# Patient Record
Sex: Female | Born: 1935 | Race: Black or African American | Hispanic: No | Smoking: Former smoker
Health system: Southern US, Community
[De-identification: ages and names within clinical notes are randomized; demographics above are authoritative.]

## PROBLEM LIST (undated history)

## (undated) DIAGNOSIS — IMO0002 Reserved for concepts with insufficient information to code with codable children: Secondary | ICD-10-CM

## (undated) DIAGNOSIS — H5462 Unqualified visual loss, left eye, normal vision right eye: Secondary | ICD-10-CM

## (undated) DIAGNOSIS — E785 Hyperlipidemia, unspecified: Secondary | ICD-10-CM

## (undated) DIAGNOSIS — E041 Nontoxic single thyroid nodule: Secondary | ICD-10-CM

## (undated) DIAGNOSIS — F329 Major depressive disorder, single episode, unspecified: Secondary | ICD-10-CM

## (undated) DIAGNOSIS — G309 Alzheimer's disease, unspecified: Secondary | ICD-10-CM

## (undated) DIAGNOSIS — Z87440 Personal history of urinary (tract) infections: Secondary | ICD-10-CM

## (undated) DIAGNOSIS — E870 Hyperosmolality and hypernatremia: Secondary | ICD-10-CM

## (undated) DIAGNOSIS — H3530 Unspecified macular degeneration: Secondary | ICD-10-CM

## (undated) DIAGNOSIS — N289 Disorder of kidney and ureter, unspecified: Secondary | ICD-10-CM

## (undated) DIAGNOSIS — R739 Hyperglycemia, unspecified: Secondary | ICD-10-CM

## (undated) DIAGNOSIS — Z0000 Encounter for general adult medical examination without abnormal findings: Secondary | ICD-10-CM

## (undated) DIAGNOSIS — C50919 Malignant neoplasm of unspecified site of unspecified female breast: Secondary | ICD-10-CM

## (undated) DIAGNOSIS — Z9889 Other specified postprocedural states: Secondary | ICD-10-CM

## (undated) DIAGNOSIS — F32A Depression, unspecified: Secondary | ICD-10-CM

## (undated) DIAGNOSIS — F0280 Dementia in other diseases classified elsewhere without behavioral disturbance: Secondary | ICD-10-CM

## (undated) HISTORY — DX: Disorder of kidney and ureter, unspecified: N28.9

## (undated) HISTORY — DX: Hyperlipidemia, unspecified: E78.5

## (undated) HISTORY — DX: Alzheimer's disease, unspecified: G30.9

## (undated) HISTORY — DX: Major depressive disorder, single episode, unspecified: F32.9

## (undated) HISTORY — DX: Other specified postprocedural states: Z98.890

## (undated) HISTORY — DX: Nontoxic single thyroid nodule: E04.1

## (undated) HISTORY — DX: Encounter for general adult medical examination without abnormal findings: Z00.00

## (undated) HISTORY — DX: Dementia in other diseases classified elsewhere, unspecified severity, without behavioral disturbance, psychotic disturbance, mood disturbance, and anxiety: F02.80

## (undated) HISTORY — DX: Hyperglycemia, unspecified: R73.9

## (undated) HISTORY — DX: Depression, unspecified: F32.A

## (undated) HISTORY — DX: Unqualified visual loss, left eye, normal vision right eye: H54.62

## (undated) HISTORY — DX: Reserved for concepts with insufficient information to code with codable children: IMO0002

## (undated) HISTORY — DX: Unspecified macular degeneration: H35.30

## (undated) HISTORY — DX: Hyperosmolality and hypernatremia: E87.0

## (undated) HISTORY — DX: Malignant neoplasm of unspecified site of unspecified female breast: C50.919

## (undated) HISTORY — PX: VAGINAL HYSTERECTOMY: SHX2639

## (undated) HISTORY — DX: Personal history of urinary (tract) infections: Z87.440

---

## 2000-10-08 ENCOUNTER — Encounter: Payer: Self-pay | Admitting: Urology

## 2000-10-10 ENCOUNTER — Ambulatory Visit (HOSPITAL_COMMUNITY): Admission: RE | Admit: 2000-10-10 | Discharge: 2000-10-10 | Payer: Self-pay | Admitting: Urology

## 2000-10-10 ENCOUNTER — Encounter (INDEPENDENT_AMBULATORY_CARE_PROVIDER_SITE_OTHER): Payer: Self-pay | Admitting: Specialist

## 2005-12-24 HISTORY — PX: BREAST LUMPECTOMY: SHX2

## 2006-01-24 DIAGNOSIS — Z9889 Other specified postprocedural states: Secondary | ICD-10-CM

## 2006-01-24 HISTORY — DX: Other specified postprocedural states: Z98.890

## 2006-03-08 ENCOUNTER — Ambulatory Visit: Admission: RE | Admit: 2006-03-08 | Discharge: 2006-05-14 | Payer: Self-pay | Admitting: *Deleted

## 2006-03-11 ENCOUNTER — Ambulatory Visit: Payer: Self-pay | Admitting: Oncology

## 2006-04-05 LAB — CBC WITH DIFFERENTIAL/PLATELET
Basophils Absolute: 0.1 10*3/uL (ref 0.0–0.1)
Eosinophils Absolute: 0.1 10*3/uL (ref 0.0–0.5)
HCT: 38.9 % (ref 34.8–46.6)
HGB: 13.2 g/dL (ref 11.6–15.9)
LYMPH%: 38.8 % (ref 14.0–48.0)
MCV: 89.8 fL (ref 81.0–101.0)
MONO#: 0.8 10*3/uL (ref 0.1–0.9)
MONO%: 12.8 % (ref 0.0–13.0)
NEUT#: 2.7 10*3/uL (ref 1.5–6.5)
NEUT%: 45.2 % (ref 39.6–76.8)
Platelets: 308 10*3/uL (ref 145–400)
WBC: 6 10*3/uL (ref 3.9–10.0)

## 2006-04-05 LAB — COMPREHENSIVE METABOLIC PANEL
Alkaline Phosphatase: 88 U/L (ref 39–117)
BUN: 13 mg/dL (ref 6–23)
CO2: 32 mEq/L (ref 19–32)
Glucose, Bld: 92 mg/dL (ref 70–99)
Total Bilirubin: 0.4 mg/dL (ref 0.3–1.2)

## 2006-04-05 LAB — LACTATE DEHYDROGENASE: LDH: 158 U/L (ref 94–250)

## 2006-04-23 DIAGNOSIS — IMO0001 Reserved for inherently not codable concepts without codable children: Secondary | ICD-10-CM

## 2006-04-23 HISTORY — DX: Reserved for inherently not codable concepts without codable children: IMO0001

## 2006-05-10 ENCOUNTER — Encounter: Admission: RE | Admit: 2006-05-10 | Discharge: 2006-05-10 | Payer: Self-pay | Admitting: Oncology

## 2006-05-15 ENCOUNTER — Ambulatory Visit: Payer: Self-pay | Admitting: Oncology

## 2006-05-17 LAB — COMPREHENSIVE METABOLIC PANEL
AST: 20 U/L (ref 0–37)
Albumin: 4.2 g/dL (ref 3.5–5.2)
Alkaline Phosphatase: 80 U/L (ref 39–117)
BUN: 12 mg/dL (ref 6–23)
Potassium: 3.3 mEq/L — ABNORMAL LOW (ref 3.5–5.3)
Total Bilirubin: 0.7 mg/dL (ref 0.3–1.2)

## 2006-05-17 LAB — CBC WITH DIFFERENTIAL/PLATELET
Basophils Absolute: 0.1 10*3/uL (ref 0.0–0.1)
EOS%: 2.2 % (ref 0.0–7.0)
HGB: 13.8 g/dL (ref 11.6–15.9)
LYMPH%: 26.6 % (ref 14.0–48.0)
MCH: 30.5 pg (ref 26.0–34.0)
MCV: 89.2 fL (ref 81.0–101.0)
MONO%: 13.3 % — ABNORMAL HIGH (ref 0.0–13.0)
Platelets: 280 10*3/uL (ref 145–400)
RBC: 4.54 10*6/uL (ref 3.70–5.32)
RDW: 13.6 % (ref 11.3–14.5)

## 2006-08-21 ENCOUNTER — Ambulatory Visit: Payer: Self-pay | Admitting: Oncology

## 2006-08-23 LAB — CBC WITH DIFFERENTIAL/PLATELET
Eosinophils Absolute: 0.1 10*3/uL (ref 0.0–0.5)
LYMPH%: 32.7 % (ref 14.0–48.0)
MCHC: 34.3 g/dL (ref 32.0–36.0)
MCV: 89.8 fL (ref 81.0–101.0)
MONO%: 11.5 % (ref 0.0–13.0)
NEUT#: 2.7 10*3/uL (ref 1.5–6.5)
Platelets: 279 10*3/uL (ref 145–400)
RBC: 4.15 10*6/uL (ref 3.70–5.32)

## 2006-08-23 LAB — COMPREHENSIVE METABOLIC PANEL
Alkaline Phosphatase: 67 U/L (ref 39–117)
Creatinine, Ser: 1.11 mg/dL (ref 0.40–1.20)
Glucose, Bld: 90 mg/dL (ref 70–99)
Sodium: 144 mEq/L (ref 135–145)
Total Bilirubin: 0.6 mg/dL (ref 0.3–1.2)
Total Protein: 6.7 g/dL (ref 6.0–8.3)

## 2006-11-19 ENCOUNTER — Ambulatory Visit: Payer: Self-pay | Admitting: Oncology

## 2006-11-22 LAB — COMPREHENSIVE METABOLIC PANEL
AST: 12 U/L (ref 0–37)
Albumin: 4.4 g/dL (ref 3.5–5.2)
Alkaline Phosphatase: 79 U/L (ref 39–117)
Potassium: 3.7 mEq/L (ref 3.5–5.3)
Sodium: 142 mEq/L (ref 135–145)
Total Protein: 7.5 g/dL (ref 6.0–8.3)

## 2006-11-22 LAB — CBC WITH DIFFERENTIAL/PLATELET
BASO%: 0.3 % (ref 0.0–2.0)
EOS%: 0.3 % (ref 0.0–7.0)
MCH: 30.8 pg (ref 26.0–34.0)
MCV: 91.1 fL (ref 81.0–101.0)
MONO%: 8.9 % (ref 0.0–13.0)
RBC: 4.41 10*6/uL (ref 3.70–5.32)
RDW: 12.7 % (ref 11.3–14.5)

## 2006-12-19 ENCOUNTER — Ambulatory Visit (HOSPITAL_COMMUNITY): Admission: RE | Admit: 2006-12-19 | Discharge: 2006-12-19 | Payer: Self-pay | Admitting: Oncology

## 2007-02-21 ENCOUNTER — Encounter: Admission: RE | Admit: 2007-02-21 | Discharge: 2007-02-21 | Payer: Self-pay | Admitting: Oncology

## 2007-03-10 ENCOUNTER — Ambulatory Visit: Payer: Self-pay | Admitting: Oncology

## 2007-03-12 LAB — CBC WITH DIFFERENTIAL/PLATELET
EOS%: 2.7 % (ref 0.0–7.0)
MCH: 30.9 pg (ref 26.0–34.0)
MCV: 88.1 fL (ref 81.0–101.0)
MONO%: 9.3 % (ref 0.0–13.0)
NEUT#: 2.8 10*3/uL (ref 1.5–6.5)
RBC: 4.35 10*6/uL (ref 3.70–5.32)
RDW: 14.6 % — ABNORMAL HIGH (ref 11.3–14.5)

## 2007-03-12 LAB — COMPREHENSIVE METABOLIC PANEL
ALT: <8 U/L (ref 0–35)
AST: 13 U/L (ref 0–37)
Albumin: 4.2 g/dL (ref 3.5–5.2)
Alkaline Phosphatase: 74 U/L (ref 39–117)
Potassium: 3.9 mEq/L (ref 3.5–5.3)
Sodium: 144 mEq/L (ref 135–145)
Total Protein: 6.8 g/dL (ref 6.0–8.3)

## 2007-04-17 ENCOUNTER — Ambulatory Visit: Payer: Self-pay | Admitting: Internal Medicine

## 2007-04-25 ENCOUNTER — Ambulatory Visit: Payer: Self-pay | Admitting: Internal Medicine

## 2007-04-25 LAB — CONVERTED CEMR LAB: Vit D, 1,25-Dihydroxy: 44 (ref 20–57)

## 2007-05-01 LAB — CONVERTED CEMR LAB
AST: 18 units/L (ref 0–37)
Albumin: 3.6 g/dL (ref 3.5–5.2)
Alkaline Phosphatase: 72 units/L (ref 39–117)
Bilirubin Urine: NEGATIVE
Bilirubin, Direct: 0.2 mg/dL (ref 0.0–0.3)
Calcium: 9.4 mg/dL (ref 8.4–10.5)
Chloride: 110 meq/L (ref 96–112)
Eosinophils Absolute: 0.2 10*3/uL (ref 0.0–0.6)
Eosinophils Relative: 3.1 % (ref 0.0–5.0)
Folate: 4.4 ng/mL
GFR calc Af Amer: 61 mL/min
GFR calc non Af Amer: 50 mL/min
Glucose, Bld: 102 mg/dL — ABNORMAL HIGH (ref 70–99)
HCT: 37.7 % (ref 36.0–46.0)
Ketones, ur: NEGATIVE mg/dL
LDL Cholesterol: 74 mg/dL (ref 0–99)
Lymphocytes Relative: 43.3 % (ref 12.0–46.0)
MCHC: 34.4 g/dL (ref 30.0–36.0)
Mucus, UA: NEGATIVE
Platelets: 275 10*3/uL (ref 150–400)
Potassium: 4.2 meq/L (ref 3.5–5.1)
RBC: 4.15 M/uL (ref 3.87–5.11)
Sodium: 148 meq/L — ABNORMAL HIGH (ref 135–145)
T4, Total: 5.9 ug/dL (ref 5.0–12.5)
TSH: 1.49 microintl units/mL (ref 0.35–5.50)
Total Bilirubin: 0.9 mg/dL (ref 0.3–1.2)
Total Protein, Urine: NEGATIVE mg/dL
Urine Glucose: NEGATIVE mg/dL
VLDL: 28 mg/dL (ref 0–40)
WBC: 6.3 10*3/uL (ref 4.5–10.5)

## 2007-05-20 ENCOUNTER — Encounter: Admission: RE | Admit: 2007-05-20 | Discharge: 2007-05-20 | Payer: Self-pay | Admitting: Oncology

## 2007-05-27 ENCOUNTER — Ambulatory Visit: Payer: Self-pay | Admitting: Internal Medicine

## 2007-06-02 ENCOUNTER — Ambulatory Visit: Payer: Self-pay | Admitting: Internal Medicine

## 2007-06-05 ENCOUNTER — Ambulatory Visit: Payer: Self-pay | Admitting: Oncology

## 2007-06-09 ENCOUNTER — Ambulatory Visit: Payer: Self-pay | Admitting: Internal Medicine

## 2007-06-09 LAB — CBC WITH DIFFERENTIAL/PLATELET
BASO%: 2.4 % — ABNORMAL HIGH (ref 0.0–2.0)
Eosinophils Absolute: 0.2 10*3/uL (ref 0.0–0.5)
LYMPH%: 36.1 % (ref 14.0–48.0)
MCHC: 34.8 g/dL (ref 32.0–36.0)
MCV: 87.8 fL (ref 81.0–101.0)
MONO%: 7.3 % (ref 0.0–13.0)
Platelets: 292 10*3/uL (ref 145–400)
RBC: 4.41 10*6/uL (ref 3.70–5.32)

## 2007-06-09 LAB — COMPREHENSIVE METABOLIC PANEL
Alkaline Phosphatase: 76 U/L (ref 39–117)
Glucose, Bld: 98 mg/dL (ref 70–99)
Sodium: 144 mEq/L (ref 135–145)
Total Bilirubin: 0.7 mg/dL (ref 0.3–1.2)
Total Protein: 6.8 g/dL (ref 6.0–8.3)

## 2007-06-11 LAB — BASIC METABOLIC PANEL
Chloride: 108 mEq/L (ref 96–112)
Glucose, Bld: 137 mg/dL — ABNORMAL HIGH (ref 70–99)
Potassium: 3.9 mEq/L (ref 3.5–5.3)
Sodium: 146 mEq/L — ABNORMAL HIGH (ref 135–145)

## 2007-10-02 ENCOUNTER — Ambulatory Visit: Payer: Self-pay | Admitting: Oncology

## 2007-10-06 LAB — CBC WITH DIFFERENTIAL/PLATELET
Eosinophils Absolute: 0.1 10*3/uL (ref 0.0–0.5)
HCT: 40.6 % (ref 34.8–46.6)
LYMPH%: 43.2 % (ref 14.0–48.0)
MCV: 91.8 fL (ref 81.0–101.0)
MONO#: 0.6 10*3/uL (ref 0.1–0.9)
NEUT#: 3.2 10*3/uL (ref 1.5–6.5)
NEUT%: 45 % (ref 39.6–76.8)
Platelets: 326 10*3/uL (ref 145–400)
WBC: 7.1 10*3/uL (ref 3.9–10.0)

## 2007-10-06 LAB — COMPREHENSIVE METABOLIC PANEL
BUN: 13 mg/dL (ref 6–23)
CO2: 26 mEq/L (ref 19–32)
Creatinine, Ser: 1.31 mg/dL — ABNORMAL HIGH (ref 0.40–1.20)
Glucose, Bld: 116 mg/dL — ABNORMAL HIGH (ref 70–99)
Total Bilirubin: 0.7 mg/dL (ref 0.3–1.2)

## 2007-10-06 LAB — LACTATE DEHYDROGENASE: LDH: 170 U/L (ref 94–250)

## 2007-11-07 ENCOUNTER — Encounter: Payer: Self-pay | Admitting: Internal Medicine

## 2008-02-04 ENCOUNTER — Ambulatory Visit: Payer: Self-pay | Admitting: Oncology

## 2008-02-06 ENCOUNTER — Ambulatory Visit (HOSPITAL_COMMUNITY): Admission: RE | Admit: 2008-02-06 | Discharge: 2008-02-06 | Payer: Self-pay | Admitting: Oncology

## 2008-02-06 ENCOUNTER — Encounter: Payer: Self-pay | Admitting: Internal Medicine

## 2008-02-06 LAB — COMPREHENSIVE METABOLIC PANEL
AST: 24 U/L (ref 0–37)
Albumin: 4.4 g/dL (ref 3.5–5.2)
BUN: 13 mg/dL (ref 6–23)
Calcium: 10 mg/dL (ref 8.4–10.5)
Chloride: 105 mEq/L (ref 96–112)
Creatinine, Ser: 1.23 mg/dL — ABNORMAL HIGH (ref 0.40–1.20)
Glucose, Bld: 126 mg/dL — ABNORMAL HIGH (ref 70–99)

## 2008-02-06 LAB — CBC WITH DIFFERENTIAL/PLATELET
Basophils Absolute: 0 10*3/uL (ref 0.0–0.1)
EOS%: 1.8 % (ref 0.0–7.0)
Eosinophils Absolute: 0.1 10*3/uL (ref 0.0–0.5)
HCT: 40.1 % (ref 34.8–46.6)
HGB: 13 g/dL (ref 11.6–15.9)
MCH: 29.8 pg (ref 26.0–34.0)
MCV: 92.2 fL (ref 81.0–101.0)
MONO%: 7.7 % (ref 0.0–13.0)
NEUT#: 4 10*3/uL (ref 1.5–6.5)
NEUT%: 58.5 % (ref 39.6–76.8)
lymph#: 2.2 10*3/uL (ref 0.9–3.3)

## 2008-03-05 IMAGING — CR DG CHEST 2V
2 series · 2 of 2 positions shown · non-contrast
Comparison: none

CLINICAL DATA: History of breast cancer.
 CHEST - 2 VIEW - 02/05/08: 
 Comparison Exam:   12/19/06.

[w chest pa]
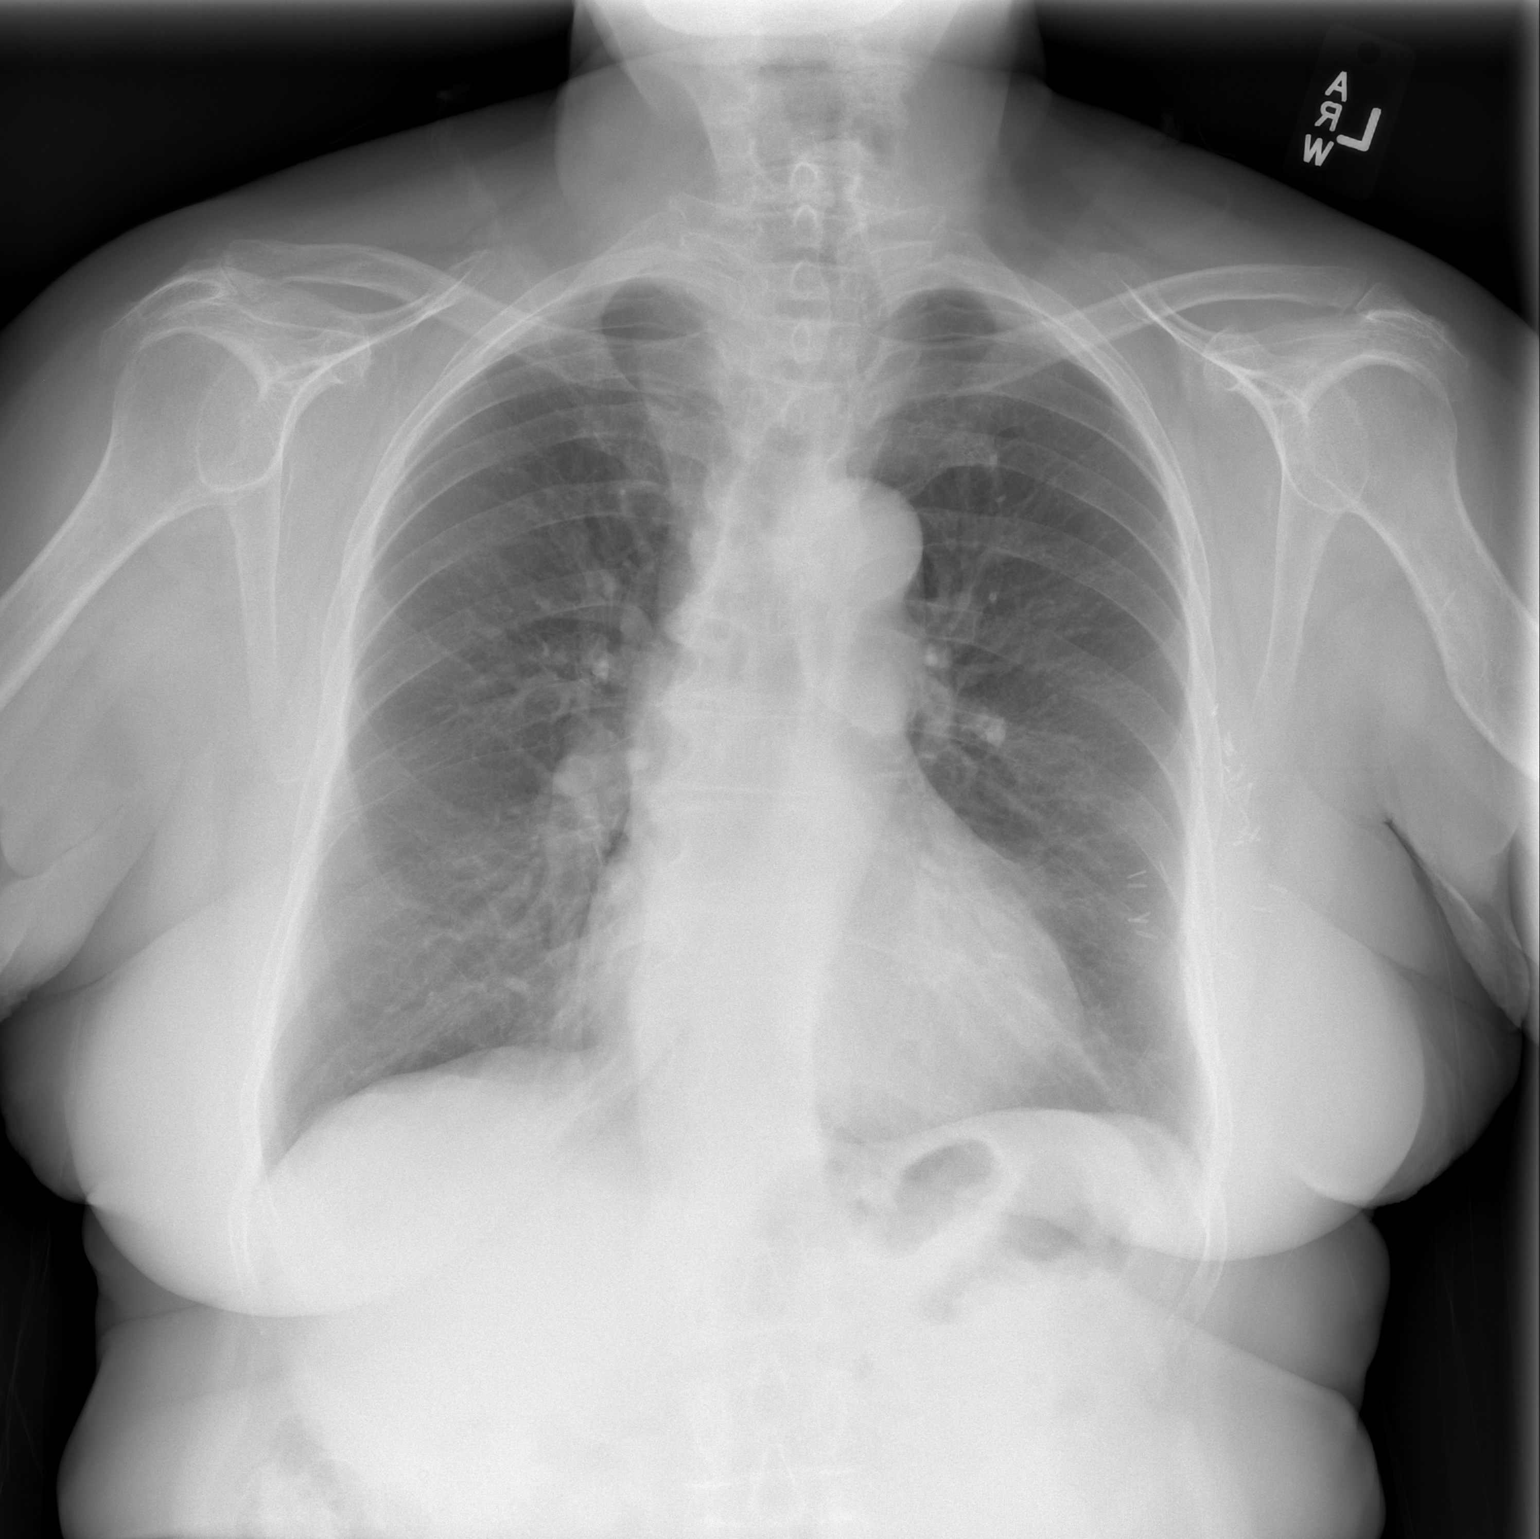

[w chest lat]
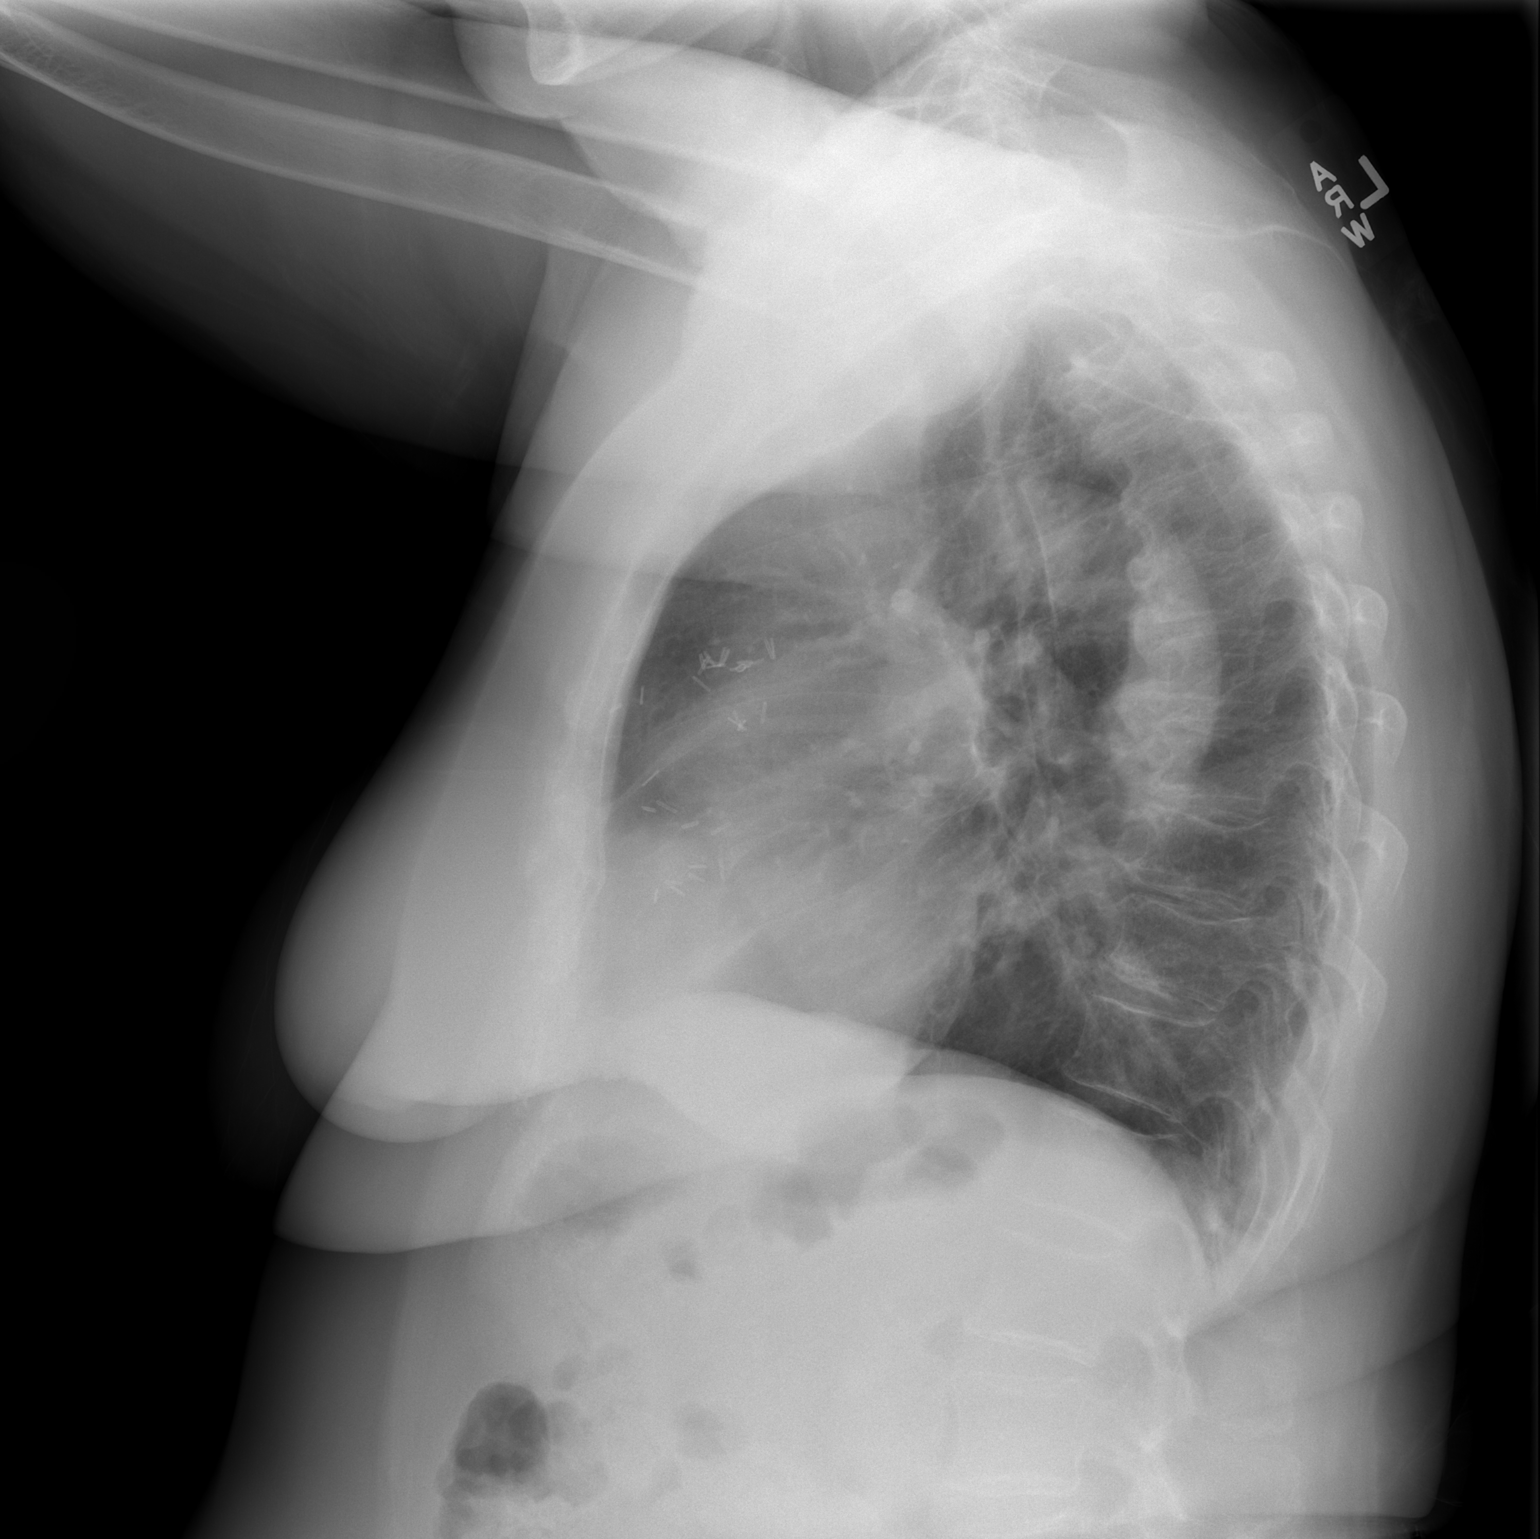

[2 of 2 positions shown; findings below may reference images not displayed]

FINDINGS: The cardiomediastinal silhouette is stable.  Degenerative changes of the thoracic spine are noted.  There is no acute infiltrate or pleural effusion.  Again noted are surgical clips in the left axilla and left chest wall, probably status post left axillary dissection.  Left tracheal deviation is probably due to goiter, unchanged from the prior exam.
IMPRESSION: No active infiltrate or pleural effusion.  Stable exam compared with the prior exam.  Again noted status post probable left axillary dissection.  Stable left tracheal deviation probably due to goiter.

## 2008-04-15 ENCOUNTER — Telehealth: Payer: Self-pay | Admitting: Internal Medicine

## 2008-05-18 ENCOUNTER — Encounter: Payer: Self-pay | Admitting: Internal Medicine

## 2008-05-21 ENCOUNTER — Encounter: Admission: RE | Admit: 2008-05-21 | Discharge: 2008-05-21 | Payer: Self-pay | Admitting: Oncology

## 2008-05-25 ENCOUNTER — Ambulatory Visit: Payer: Self-pay | Admitting: Internal Medicine

## 2008-05-25 DIAGNOSIS — G309 Alzheimer's disease, unspecified: Secondary | ICD-10-CM

## 2008-05-25 DIAGNOSIS — F0280 Dementia in other diseases classified elsewhere without behavioral disturbance: Secondary | ICD-10-CM | POA: Insufficient documentation

## 2008-05-26 ENCOUNTER — Telehealth: Payer: Self-pay | Admitting: Internal Medicine

## 2008-05-26 ENCOUNTER — Ambulatory Visit: Payer: Self-pay | Admitting: Internal Medicine

## 2008-05-26 LAB — CONVERTED CEMR LAB
Alkaline Phosphatase: 64 units/L (ref 39–117)
Bilirubin Urine: NEGATIVE
Calcium: 8.8 mg/dL (ref 8.4–10.5)
GFR calc non Af Amer: 41 mL/min
Glucose, Bld: 131 mg/dL — ABNORMAL HIGH (ref 70–99)
Hemoglobin, Urine: NEGATIVE
Nitrite: NEGATIVE
Potassium: 3.7 meq/L (ref 3.5–5.1)
RBC / HPF: NONE SEEN
TSH: 0.63 microintl units/mL (ref 0.35–5.50)
Urobilinogen, UA: 0.2 (ref 0.0–1.0)
Vitamin B-12: 1397 pg/mL — ABNORMAL HIGH (ref 211–911)
pH: 5.5 (ref 5.0–8.0)

## 2008-05-31 ENCOUNTER — Ambulatory Visit: Payer: Self-pay | Admitting: Oncology

## 2008-06-03 ENCOUNTER — Encounter: Payer: Self-pay | Admitting: Internal Medicine

## 2008-06-03 LAB — CBC WITH DIFFERENTIAL/PLATELET
EOS%: 2.6 % (ref 0.0–7.0)
LYMPH%: 39.2 % (ref 14.0–48.0)
MCH: 31.1 pg (ref 26.0–34.0)
MCV: 92.7 fL (ref 81.0–101.0)
MONO%: 9 % (ref 0.0–13.0)
Platelets: 290 10*3/uL (ref 145–400)
RBC: 4.02 10*6/uL (ref 3.70–5.32)
RDW: 12.9 % (ref 11.3–14.5)

## 2008-06-03 LAB — COMPREHENSIVE METABOLIC PANEL
ALT: 19 U/L (ref 0–35)
AST: 25 U/L (ref 0–37)
Creatinine, Ser: 1.15 mg/dL (ref 0.40–1.20)
Total Bilirubin: 0.5 mg/dL (ref 0.3–1.2)

## 2008-08-02 ENCOUNTER — Telehealth: Payer: Self-pay | Admitting: Internal Medicine

## 2008-08-04 ENCOUNTER — Telehealth: Payer: Self-pay | Admitting: Internal Medicine

## 2008-08-12 ENCOUNTER — Encounter: Payer: Self-pay | Admitting: Internal Medicine

## 2008-09-03 ENCOUNTER — Ambulatory Visit: Payer: Self-pay | Admitting: Internal Medicine

## 2008-09-03 DIAGNOSIS — C50919 Malignant neoplasm of unspecified site of unspecified female breast: Secondary | ICD-10-CM | POA: Insufficient documentation

## 2008-09-03 DIAGNOSIS — M810 Age-related osteoporosis without current pathological fracture: Secondary | ICD-10-CM | POA: Insufficient documentation

## 2008-09-03 DIAGNOSIS — E785 Hyperlipidemia, unspecified: Secondary | ICD-10-CM

## 2008-09-23 ENCOUNTER — Telehealth: Payer: Self-pay | Admitting: Internal Medicine

## 2008-10-01 ENCOUNTER — Ambulatory Visit: Payer: Self-pay | Admitting: Oncology

## 2008-10-05 ENCOUNTER — Encounter: Payer: Self-pay | Admitting: Internal Medicine

## 2008-10-12 ENCOUNTER — Encounter: Payer: Self-pay | Admitting: Internal Medicine

## 2008-10-12 ENCOUNTER — Ambulatory Visit (HOSPITAL_BASED_OUTPATIENT_CLINIC_OR_DEPARTMENT_OTHER): Admission: RE | Admit: 2008-10-12 | Discharge: 2008-10-12 | Payer: Self-pay | Admitting: Internal Medicine

## 2008-10-13 ENCOUNTER — Telehealth: Payer: Self-pay | Admitting: Internal Medicine

## 2008-10-15 ENCOUNTER — Encounter: Payer: Self-pay | Admitting: Internal Medicine

## 2008-10-15 LAB — CBC WITH DIFFERENTIAL/PLATELET
BASO%: 0.9 % (ref 0.0–2.0)
Basophils Absolute: 0.1 10*3/uL (ref 0.0–0.1)
EOS%: 2.8 % (ref 0.0–7.0)
HGB: 12.4 g/dL (ref 11.6–15.9)
MCH: 31.2 pg (ref 26.0–34.0)
MCHC: 33.5 g/dL (ref 32.0–36.0)
RBC: 3.96 10*6/uL (ref 3.70–5.32)
RDW: 13.6 % (ref 11.3–14.5)
lymph#: 2.5 10*3/uL (ref 0.9–3.3)

## 2008-10-15 LAB — COMPREHENSIVE METABOLIC PANEL
ALT: 17 U/L (ref 0–35)
AST: 23 U/L (ref 0–37)
Albumin: 4.2 g/dL (ref 3.5–5.2)
Calcium: 9.8 mg/dL (ref 8.4–10.5)
Chloride: 105 mEq/L (ref 96–112)
Potassium: 4.2 mEq/L (ref 3.5–5.3)
Sodium: 144 mEq/L (ref 135–145)
Total Protein: 6.9 g/dL (ref 6.0–8.3)

## 2008-10-22 ENCOUNTER — Ambulatory Visit (HOSPITAL_COMMUNITY): Admission: RE | Admit: 2008-10-22 | Discharge: 2008-10-22 | Payer: Self-pay | Admitting: Internal Medicine

## 2008-10-22 ENCOUNTER — Encounter (INDEPENDENT_AMBULATORY_CARE_PROVIDER_SITE_OTHER): Payer: Self-pay | Admitting: Interventional Radiology

## 2008-10-27 ENCOUNTER — Ambulatory Visit: Payer: Self-pay | Admitting: Internal Medicine

## 2008-10-27 DIAGNOSIS — R05 Cough: Secondary | ICD-10-CM

## 2008-10-27 DIAGNOSIS — R059 Cough, unspecified: Secondary | ICD-10-CM | POA: Insufficient documentation

## 2008-10-27 DIAGNOSIS — E041 Nontoxic single thyroid nodule: Secondary | ICD-10-CM

## 2008-11-05 ENCOUNTER — Ambulatory Visit: Payer: Self-pay | Admitting: Endocrinology

## 2008-11-12 ENCOUNTER — Encounter: Payer: Self-pay | Admitting: Internal Medicine

## 2008-12-02 ENCOUNTER — Ambulatory Visit: Payer: Self-pay | Admitting: Internal Medicine

## 2008-12-02 DIAGNOSIS — M79609 Pain in unspecified limb: Secondary | ICD-10-CM | POA: Insufficient documentation

## 2008-12-07 ENCOUNTER — Ambulatory Visit: Payer: Self-pay | Admitting: Gastroenterology

## 2008-12-07 DIAGNOSIS — R197 Diarrhea, unspecified: Secondary | ICD-10-CM | POA: Insufficient documentation

## 2008-12-09 ENCOUNTER — Encounter: Payer: Self-pay | Admitting: Gastroenterology

## 2008-12-29 ENCOUNTER — Telehealth: Payer: Self-pay | Admitting: Internal Medicine

## 2009-01-12 ENCOUNTER — Telehealth: Payer: Self-pay | Admitting: Internal Medicine

## 2009-01-27 ENCOUNTER — Ambulatory Visit: Payer: Self-pay | Admitting: Oncology

## 2009-02-07 ENCOUNTER — Ambulatory Visit: Payer: Self-pay | Admitting: Gastroenterology

## 2009-02-08 ENCOUNTER — Encounter: Payer: Self-pay | Admitting: Internal Medicine

## 2009-02-08 ENCOUNTER — Ambulatory Visit (HOSPITAL_COMMUNITY): Admission: RE | Admit: 2009-02-08 | Discharge: 2009-02-08 | Payer: Self-pay | Admitting: Oncology

## 2009-02-08 LAB — COMPREHENSIVE METABOLIC PANEL
ALT: 33 U/L (ref 0–35)
BUN: 15 mg/dL (ref 6–23)
CO2: 25 mEq/L (ref 19–32)
Calcium: 9.8 mg/dL (ref 8.4–10.5)
Chloride: 105 mEq/L (ref 96–112)
Creatinine, Ser: 1.1 mg/dL (ref 0.40–1.20)

## 2009-02-08 LAB — CBC WITH DIFFERENTIAL/PLATELET
Basophils Absolute: 0.1 10*3/uL (ref 0.0–0.1)
EOS%: 2.5 % (ref 0.0–7.0)
Eosinophils Absolute: 0.2 10*3/uL (ref 0.0–0.5)
HCT: 38.3 % (ref 34.8–46.6)
HGB: 13 g/dL (ref 11.6–15.9)
MCH: 31.1 pg (ref 26.0–34.0)
MCV: 91.4 fL (ref 81.0–101.0)
MONO%: 8.6 % (ref 0.0–13.0)
NEUT#: 3.8 10*3/uL (ref 1.5–6.5)
NEUT%: 52.9 % (ref 39.6–76.8)
Platelets: 263 10*3/uL (ref 145–400)

## 2009-02-08 LAB — LACTATE DEHYDROGENASE: LDH: 203 U/L (ref 94–250)

## 2009-02-24 ENCOUNTER — Telehealth (INDEPENDENT_AMBULATORY_CARE_PROVIDER_SITE_OTHER): Payer: Self-pay | Admitting: *Deleted

## 2009-05-05 ENCOUNTER — Ambulatory Visit: Payer: Self-pay | Admitting: Oncology

## 2009-05-09 ENCOUNTER — Encounter: Payer: Self-pay | Admitting: Internal Medicine

## 2009-05-09 LAB — CBC WITH DIFFERENTIAL/PLATELET
BASO%: 0.5 % (ref 0.0–2.0)
EOS%: 2.4 % (ref 0.0–7.0)
HCT: 37.3 % (ref 34.8–46.6)
LYMPH%: 32.2 % (ref 14.0–49.7)
MCH: 30.9 pg (ref 25.1–34.0)
MCHC: 33.3 g/dL (ref 31.5–36.0)
MCV: 92.8 fL (ref 79.5–101.0)
MONO%: 9.8 % (ref 0.0–14.0)
NEUT%: 55.1 % (ref 38.4–76.8)
lymph#: 2 10*3/uL (ref 0.9–3.3)

## 2009-05-09 LAB — COMPREHENSIVE METABOLIC PANEL
AST: 31 U/L (ref 0–37)
Alkaline Phosphatase: 59 U/L (ref 39–117)
BUN: 12 mg/dL (ref 6–23)
Creatinine, Ser: 1.17 mg/dL (ref 0.40–1.20)
Glucose, Bld: 145 mg/dL — ABNORMAL HIGH (ref 70–99)
Potassium: 4 mEq/L (ref 3.5–5.3)
Total Bilirubin: 0.6 mg/dL (ref 0.3–1.2)

## 2009-05-10 ENCOUNTER — Ambulatory Visit: Payer: Self-pay | Admitting: Endocrinology

## 2009-05-19 ENCOUNTER — Encounter: Admission: RE | Admit: 2009-05-19 | Discharge: 2009-05-19 | Payer: Self-pay | Admitting: Endocrinology

## 2009-05-24 ENCOUNTER — Encounter: Admission: RE | Admit: 2009-05-24 | Discharge: 2009-05-24 | Payer: Self-pay | Admitting: Oncology

## 2009-06-15 ENCOUNTER — Telehealth: Payer: Self-pay | Admitting: Internal Medicine

## 2009-06-16 ENCOUNTER — Ambulatory Visit: Payer: Self-pay | Admitting: Internal Medicine

## 2009-06-19 ENCOUNTER — Telehealth: Payer: Self-pay | Admitting: Internal Medicine

## 2009-06-19 ENCOUNTER — Encounter: Payer: Self-pay | Admitting: Internal Medicine

## 2009-06-22 ENCOUNTER — Encounter: Admission: RE | Admit: 2009-06-22 | Discharge: 2009-06-22 | Payer: Self-pay | Admitting: Internal Medicine

## 2009-06-22 ENCOUNTER — Encounter: Payer: Self-pay | Admitting: Endocrinology

## 2009-06-22 ENCOUNTER — Other Ambulatory Visit: Admission: RE | Admit: 2009-06-22 | Discharge: 2009-06-22 | Payer: Self-pay | Admitting: Diagnostic Radiology

## 2009-06-23 ENCOUNTER — Ambulatory Visit: Payer: Self-pay | Admitting: Endocrinology

## 2009-06-24 ENCOUNTER — Telehealth: Payer: Self-pay | Admitting: Internal Medicine

## 2009-08-05 ENCOUNTER — Ambulatory Visit: Payer: Self-pay | Admitting: Oncology

## 2009-08-18 LAB — CONVERTED CEMR LAB
ALT: 17 units/L (ref 0–35)
AST: 22 units/L (ref 0–37)
Alkaline Phosphatase: 66 units/L (ref 39–117)
Bilirubin, Direct: 0.1 mg/dL (ref 0.0–0.3)
CO2: 27 meq/L (ref 19–32)
Cholesterol: 158 mg/dL (ref 0–200)
Creatinine, Ser: 1.31 mg/dL — ABNORMAL HIGH (ref 0.40–1.20)
Glucose, Bld: 157 mg/dL — ABNORMAL HIGH (ref 70–99)
HDL: 61 mg/dL (ref >39)
Hemoglobin: 13.1 g/dL (ref 12.0–15.0)
Indirect Bilirubin: 0.3 mg/dL (ref 0.0–0.9)
RBC: 4.29 M/uL (ref 3.87–5.11)
Total Protein: 6.9 g/dL (ref 6.0–8.3)
Triglycerides: 205 mg/dL — ABNORMAL HIGH (ref <150)
VLDL: 41 mg/dL — ABNORMAL HIGH (ref 0–40)
Vitamin B-12: >2000 pg/mL — ABNORMAL HIGH (ref 211–911)

## 2009-08-24 HISTORY — PX: LOBECTOMY: SHX5089

## 2009-08-26 ENCOUNTER — Encounter: Payer: Self-pay | Admitting: Internal Medicine

## 2009-08-26 LAB — CBC WITH DIFFERENTIAL/PLATELET
BASO%: 0.9 % (ref 0.0–2.0)
EOS%: 2.9 % (ref 0.0–7.0)
MCH: 30.4 pg (ref 25.1–34.0)
MCHC: 33.2 g/dL (ref 31.5–36.0)
MCV: 91.5 fL (ref 79.5–101.0)
MONO%: 7.3 % (ref 0.0–14.0)
RDW: 13.3 % (ref 11.2–14.5)
lymph#: 2.6 10*3/uL (ref 0.9–3.3)

## 2009-08-26 LAB — COMPREHENSIVE METABOLIC PANEL
ALT: 20 U/L (ref 0–35)
AST: 25 U/L (ref 0–37)
Albumin: 4.4 g/dL (ref 3.5–5.2)
Alkaline Phosphatase: 71 U/L (ref 39–117)
Calcium: 9.8 mg/dL (ref 8.4–10.5)
Chloride: 106 mEq/L (ref 96–112)
Creatinine, Ser: 1.19 mg/dL (ref 0.40–1.20)
Potassium: 3.9 mEq/L (ref 3.5–5.3)

## 2009-08-26 LAB — LACTATE DEHYDROGENASE: LDH: 182 U/L (ref 94–250)

## 2009-08-31 ENCOUNTER — Encounter: Payer: Self-pay | Admitting: Endocrinology

## 2009-08-31 ENCOUNTER — Encounter: Payer: Self-pay | Admitting: Internal Medicine

## 2009-09-20 ENCOUNTER — Ambulatory Visit (HOSPITAL_COMMUNITY): Admission: RE | Admit: 2009-09-20 | Discharge: 2009-09-21 | Payer: Self-pay | Admitting: Surgery

## 2009-09-20 ENCOUNTER — Encounter (INDEPENDENT_AMBULATORY_CARE_PROVIDER_SITE_OTHER): Payer: Self-pay | Admitting: Surgery

## 2009-09-28 ENCOUNTER — Encounter: Payer: Self-pay | Admitting: Endocrinology

## 2009-11-28 ENCOUNTER — Telehealth: Payer: Self-pay | Admitting: Family

## 2009-11-28 ENCOUNTER — Ambulatory Visit: Payer: Self-pay | Admitting: Diagnostic Radiology

## 2009-11-28 ENCOUNTER — Ambulatory Visit: Payer: Self-pay | Admitting: Family

## 2009-11-28 ENCOUNTER — Ambulatory Visit (HOSPITAL_BASED_OUTPATIENT_CLINIC_OR_DEPARTMENT_OTHER): Admission: RE | Admit: 2009-11-28 | Discharge: 2009-11-28 | Payer: Self-pay | Admitting: Internal Medicine

## 2009-11-28 DIAGNOSIS — J189 Pneumonia, unspecified organism: Secondary | ICD-10-CM | POA: Insufficient documentation

## 2009-11-28 DIAGNOSIS — J40 Bronchitis, not specified as acute or chronic: Secondary | ICD-10-CM | POA: Insufficient documentation

## 2009-11-29 ENCOUNTER — Telehealth: Payer: Self-pay | Admitting: Family

## 2009-11-30 ENCOUNTER — Inpatient Hospital Stay (HOSPITAL_COMMUNITY): Admission: EM | Admit: 2009-11-30 | Discharge: 2009-12-03 | Payer: Self-pay | Admitting: Emergency Medicine

## 2009-11-30 ENCOUNTER — Ambulatory Visit: Payer: Self-pay | Admitting: Family

## 2009-11-30 ENCOUNTER — Telehealth: Payer: Self-pay | Admitting: Family

## 2009-12-09 ENCOUNTER — Ambulatory Visit: Payer: Self-pay | Admitting: Oncology

## 2009-12-09 ENCOUNTER — Ambulatory Visit: Payer: Self-pay | Admitting: Internal Medicine

## 2009-12-09 LAB — HM COLONOSCOPY

## 2009-12-13 ENCOUNTER — Encounter: Payer: Self-pay | Admitting: Internal Medicine

## 2009-12-13 LAB — CBC WITH DIFFERENTIAL/PLATELET
Basophils Absolute: 0 10*3/uL (ref 0.0–0.1)
EOS%: 3.3 % (ref 0.0–7.0)
Eosinophils Absolute: 0.2 10*3/uL (ref 0.0–0.5)
HGB: 11.7 g/dL (ref 11.6–15.9)
LYMPH%: 41.7 % (ref 14.0–49.7)
MCH: 29.8 pg (ref 25.1–34.0)
MCV: 90.4 fL (ref 79.5–101.0)
MONO%: 10.4 % (ref 0.0–14.0)
NEUT#: 2.3 10*3/uL (ref 1.5–6.5)
Platelets: 513 10*3/uL — ABNORMAL HIGH (ref 145–400)

## 2009-12-13 LAB — COMPREHENSIVE METABOLIC PANEL
ALT: 15 U/L (ref 0–35)
Albumin: 3.8 g/dL (ref 3.5–5.2)
CO2: 32 mEq/L (ref 19–32)
Calcium: 10 mg/dL (ref 8.4–10.5)
Total Protein: 6.7 g/dL (ref 6.0–8.3)

## 2009-12-13 LAB — LACTATE DEHYDROGENASE: LDH: 161 U/L (ref 94–250)

## 2009-12-21 ENCOUNTER — Telehealth: Payer: Self-pay | Admitting: Internal Medicine

## 2010-01-31 ENCOUNTER — Ambulatory Visit: Payer: Self-pay | Admitting: Internal Medicine

## 2010-01-31 DIAGNOSIS — R358 Other polyuria: Secondary | ICD-10-CM

## 2010-01-31 DIAGNOSIS — R3589 Other polyuria: Secondary | ICD-10-CM | POA: Insufficient documentation

## 2010-01-31 LAB — CONVERTED CEMR LAB
BUN: 14 mg/dL (ref 6–23)
CO2: 26 meq/L (ref 19–32)
Calcium: 9.7 mg/dL (ref 8.4–10.5)
Creatinine, Ser: 1.24 mg/dL — ABNORMAL HIGH (ref 0.40–1.20)
Hgb A1c MFr Bld: 6.1 % (ref 4.6–6.1)

## 2010-02-01 ENCOUNTER — Telehealth: Payer: Self-pay | Admitting: Internal Medicine

## 2010-02-01 ENCOUNTER — Encounter: Payer: Self-pay | Admitting: Internal Medicine

## 2010-02-14 ENCOUNTER — Telehealth: Payer: Self-pay | Admitting: Internal Medicine

## 2010-03-18 ENCOUNTER — Ambulatory Visit: Payer: Self-pay | Admitting: Family Medicine

## 2010-03-20 ENCOUNTER — Telehealth: Payer: Self-pay | Admitting: Internal Medicine

## 2010-03-22 ENCOUNTER — Telehealth: Payer: Self-pay | Admitting: Internal Medicine

## 2010-03-22 ENCOUNTER — Ambulatory Visit (HOSPITAL_BASED_OUTPATIENT_CLINIC_OR_DEPARTMENT_OTHER): Admission: RE | Admit: 2010-03-22 | Discharge: 2010-03-22 | Payer: Self-pay | Admitting: Internal Medicine

## 2010-03-22 ENCOUNTER — Ambulatory Visit: Payer: Self-pay | Admitting: Diagnostic Radiology

## 2010-03-22 ENCOUNTER — Ambulatory Visit: Payer: Self-pay | Admitting: Internal Medicine

## 2010-04-11 ENCOUNTER — Ambulatory Visit: Payer: Self-pay | Admitting: Oncology

## 2010-04-27 ENCOUNTER — Telehealth: Payer: Self-pay | Admitting: Internal Medicine

## 2010-04-28 ENCOUNTER — Encounter: Payer: Self-pay | Admitting: Internal Medicine

## 2010-04-28 LAB — COMPREHENSIVE METABOLIC PANEL
ALT: <8 U/L (ref 0–35)
Alkaline Phosphatase: 60 U/L (ref 39–117)
Creatinine, Ser: 1.05 mg/dL (ref 0.40–1.20)
Glucose, Bld: 102 mg/dL — ABNORMAL HIGH (ref 70–99)
Sodium: 142 mEq/L (ref 135–145)
Total Bilirubin: 0.4 mg/dL (ref 0.3–1.2)
Total Protein: 6.9 g/dL (ref 6.0–8.3)

## 2010-04-28 LAB — CBC WITH DIFFERENTIAL/PLATELET
EOS%: 2.9 % (ref 0.0–7.0)
LYMPH%: 41 % (ref 14.0–49.7)
MCH: 29.3 pg (ref 25.1–34.0)
MCV: 89.1 fL (ref 79.5–101.0)
MONO%: 9.3 % (ref 0.0–14.0)
RBC: 4.5 10*6/uL (ref 3.70–5.45)
RDW: 14.2 % (ref 11.2–14.5)

## 2010-04-28 LAB — LACTATE DEHYDROGENASE: LDH: 180 U/L (ref 94–250)

## 2010-05-01 ENCOUNTER — Encounter: Payer: Self-pay | Admitting: Internal Medicine

## 2010-05-08 ENCOUNTER — Ambulatory Visit: Payer: Self-pay | Admitting: Internal Medicine

## 2010-05-08 LAB — CONVERTED CEMR LAB: TSH: 2 microintl units/mL (ref 0.35–5.50)

## 2010-05-25 ENCOUNTER — Encounter: Admission: RE | Admit: 2010-05-25 | Discharge: 2010-05-25 | Payer: Self-pay | Admitting: Oncology

## 2010-06-01 ENCOUNTER — Ambulatory Visit: Payer: Self-pay | Admitting: Internal Medicine

## 2010-08-24 ENCOUNTER — Ambulatory Visit: Payer: Self-pay | Admitting: Oncology

## 2010-08-29 ENCOUNTER — Encounter: Payer: Self-pay | Admitting: Internal Medicine

## 2010-08-29 LAB — CBC WITH DIFFERENTIAL/PLATELET
BASO%: 0.4 % (ref 0.0–2.0)
Basophils Absolute: 0 10*3/uL (ref 0.0–0.1)
EOS%: 3.2 % (ref 0.0–7.0)
HGB: 12.4 g/dL (ref 11.6–15.9)
MCH: 30.9 pg (ref 25.1–34.0)
MCHC: 33.5 g/dL (ref 31.5–36.0)
RDW: 13.9 % (ref 11.2–14.5)
lymph#: 2.1 10*3/uL (ref 0.9–3.3)

## 2010-08-29 LAB — COMPREHENSIVE METABOLIC PANEL
ALT: 16 U/L (ref 0–35)
AST: 29 U/L (ref 0–37)
Albumin: 3.9 g/dL (ref 3.5–5.2)
Calcium: 9.2 mg/dL (ref 8.4–10.5)
Chloride: 103 mEq/L (ref 96–112)
Potassium: 3.7 mEq/L (ref 3.5–5.3)
Sodium: 142 mEq/L (ref 135–145)
Total Protein: 6.6 g/dL (ref 6.0–8.3)

## 2010-10-10 ENCOUNTER — Ambulatory Visit: Payer: Self-pay | Admitting: Oncology

## 2010-12-01 ENCOUNTER — Encounter: Payer: Self-pay | Admitting: Internal Medicine

## 2010-12-01 ENCOUNTER — Ambulatory Visit: Payer: Self-pay | Admitting: Internal Medicine

## 2010-12-01 DIAGNOSIS — E039 Hypothyroidism, unspecified: Secondary | ICD-10-CM | POA: Insufficient documentation

## 2010-12-01 LAB — CONVERTED CEMR LAB

## 2010-12-04 ENCOUNTER — Telehealth: Payer: Self-pay | Admitting: Internal Medicine

## 2010-12-04 ENCOUNTER — Encounter: Payer: Self-pay | Admitting: Internal Medicine

## 2010-12-04 LAB — CONVERTED CEMR LAB
ALT: 21 units/L (ref 0–35)
BUN: 10 mg/dL (ref 6–23)
Bilirubin, Direct: 0.1 mg/dL (ref 0.0–0.3)
CO2: 26 meq/L (ref 19–32)
Chloride: 107 meq/L (ref 96–112)
HDL: 57 mg/dL (ref >39)
Sodium: 144 meq/L (ref 135–145)
Total Bilirubin: 0.3 mg/dL (ref 0.3–1.2)
Total Protein: 6.2 g/dL (ref 6.0–8.3)
VLDL: 61 mg/dL — ABNORMAL HIGH (ref 0–40)

## 2010-12-27 ENCOUNTER — Ambulatory Visit: Payer: Self-pay | Admitting: Oncology

## 2011-01-14 ENCOUNTER — Encounter (HOSPITAL_COMMUNITY): Payer: Self-pay | Admitting: Oncology

## 2011-01-23 NOTE — Letter (Signed)
   [place] [facility] [address] [place], [place]  [address]  [place] Phone: [phone]      February 01, 2010   [facility] [address] [place], [place] [address]  RE:  LAB RESULTS  Dear  Ms. Yon,  The following is an interpretation of your most recent lab tests.  Please take note of any instructions provided or changes to medications that have resulted from your lab work.  ELECTROLYTES:  Good - no changes needed  KIDNEY FUNCTION TESTS:  Good - no changes needed    DIABETIC STUDIES:  Good - no changes needed Blood Glucose: 140   HgbA1C: 6.1          Sincerely Yours,    Dr. Thomos Lemons

## 2011-01-23 NOTE — Assessment & Plan Note (Signed)
Summary: BAD COUGH//VGJ   Vital Signs:  Patient profile:   77 year old female Weight:      174 pounds O2 Sat:      98 % on Room air Temp:     98.3 degrees F oral Pulse rate:   84 / minute Pulse rhythm:   regular BP sitting:   120 / 70  (right arm) Cuff size:   regular  Vitals Entered By: Lowella Petties CMA (March 18, 2010 12:07 PM)  O2 Flow:  Room air CC: Cough, worse since last night   History of Present Illness: 77 yo here for 2 days of cough, worsening last night. Pt has Alzheimer's, history provided by daughter. No coughing anything up but cough sounds deep. Has some nasal congestion. Eating less than usual. Not complaing of chest pain, shortness of breath, dysuria.  Pertinent PMH- has had pneumonia twice in past few months, once requiring hospitalization. According to daughter, has allergy to Avelox and Azithromycin. Was given Ceftin in hospital without difficulty.  Current Medications (verified): 1)  Namenda 10 Mg  Tabs (Memantine Hcl) .... Take 1 Tablet By Mouth Two Times A Day 2)  Klor-Con 10 10 Meq  Tbcr (Potassium Chloride) .... One By Mouth Once Daily 3)  Mirtazapine 15 Mg  Tabs (Mirtazapine) .... Take 1 Tablet By Mouth Once A Day At Bedtime 4)  Simvastatin 20 Mg  Tabs (Simvastatin) .... Take 1 Tablet By Mouth Once A Day 5)  Aricept 10 Mg Tabs (Donepezil Hcl) .... One By Mouth Qd 6)  Furosemide 20 Mg  Tabs (Furosemide) .... Take 1 Tablet By Mouth Once A Day 7)  Adult Aspirin Ec Low Strength 81 Mg  Tbec (Aspirin) .... Take 1 Tablet By Mouth Once A Day 8)  Arimidex 1 Mg  Tabs (Anastrozole) .... Take 1 Tablet By Mouth Once A Day 9)  Effexor 37.5 Mg  Tabs (Venlafaxine Hcl) .... Take 1 Tablet By Mouth Two Times A Day 10)  Vitamin D3 1000 Unit Tabs (Cholecalciferol) .Marland Kitchen.. 1 Tablet By Mouth Once Daily 11)  Cvs Calcium-600 600 Mg Tabs (Calcium Carbonate) .Marland Kitchen.. 1 Tab Qd 12)  Imodium A-D 2 Mg Tabs (Loperamide Hcl) .Marland Kitchen.. 1 Every Other Day 13)  Tessalon Perles 100 Mg Caps  (Benzonatate) .... One Cap By Mouth Three Times A Day As Needed Cough 14)  Levothyroxine Sodium 25 Mcg Tabs (Levothyroxine Sodium) .... One By Mouth Qpm 15)  Cefuroxime Axetil 500 Mg  Tabs (Cefuroxime Axetil) .Marland Kitchen.. 1 By Mouth 2 Times Daily X 10 Days  Allergies (verified): 1)  Codeine Phosphate (Codeine Phosphate) 2)  Ibuprofen (Ibuprofen) 3)  Avelox (Moxifloxacin Hcl in Nacl)  Review of Systems      See HPI General:  Denies chills and fever. Resp:  Complains of cough; denies shortness of breath, sputum productive, and wheezing.  Physical Exam  General:  alert, well-developed, and well-nourished.   VSS- O2 sat 98%, normotensive Nose:  mucosal erythema.   Mouth:  MMM Lungs:  normal respiratory effort.  decrease breath sounds left base, no wheezes or crackles Heart:  normal rate, regular rhythm, and no gallop.   Extremities:  No lower extremity edema  Psych:  normally interactive and good eye contact.     Impression & Recommendations:  Problem # 1:  COUGH (ICD-786.2) Assessment New Non toxic but given PMH of pneumonia complicated by her inability to describe symptoms, will go ahead and treat with abx. Threshold low for going to ER over the weekend. Asked daughter to  page me if symptoms progress. Ceftin 500 mg two times a day x 10 days.  Complete Medication List: 1)  Namenda 10 Mg Tabs (Memantine hcl) .... Take 1 tablet by mouth two times a day 2)  Klor-con 10 10 Meq Tbcr (Potassium chloride) .... One by mouth once daily 3)  Mirtazapine 15 Mg Tabs (Mirtazapine) .... Take 1 tablet by mouth once a day at bedtime 4)  Simvastatin 20 Mg Tabs (Simvastatin) .... Take 1 tablet by mouth once a day 5)  Aricept 10 Mg Tabs (Donepezil hcl) .... One by mouth qd 6)  Furosemide 20 Mg Tabs (Furosemide) .... Take 1 tablet by mouth once a day 7)  Adult Aspirin Ec Low Strength 81 Mg Tbec (Aspirin) .... Take 1 tablet by mouth once a day 8)  Arimidex 1 Mg Tabs (Anastrozole) .... Take 1 tablet by  mouth once a day 9)  Effexor 37.5 Mg Tabs (Venlafaxine hcl) .... Take 1 tablet by mouth two times a day 10)  Vitamin D3 1000 Unit Tabs (Cholecalciferol) .Marland Kitchen.. 1 tablet by mouth once daily 11)  Cvs Calcium-600 600 Mg Tabs (Calcium carbonate) .Marland Kitchen.. 1 tab qd 12)  Imodium A-d 2 Mg Tabs (Loperamide hcl) .Marland Kitchen.. 1 every other day 13)  Tessalon Perles 100 Mg Caps (Benzonatate) .... One cap by mouth three times a day as needed cough 14)  Levothyroxine Sodium 25 Mcg Tabs (Levothyroxine sodium) .... One by mouth qpm 15)  Cefuroxime Axetil 500 Mg Tabs (Cefuroxime axetil) .Marland Kitchen.. 1 by mouth 2 times daily x 10 days Prescriptions: TESSALON PERLES 100 MG CAPS (BENZONATATE) one cap by mouth three times a day as needed cough  #30 x 0   Entered and Authorized by:   Ruthe Mannan MD   Signed by:   Ruthe Mannan MD on 03/18/2010   Method used:   [facility] [address] [phone]* (retail)       [address]       [place], [place]  [address]       Ph: [phone] or [phone]       Fax: [phone]   RxID:   [id number] CEFUROXIME AXETIL 500 MG  TABS (CEFUROXIME AXETIL) 1 by mouth 2 times daily x 10 days  #20 x 0   Entered and Authorized by:   Ruthe Mannan MD   Signed by:   Ruthe Mannan MD on 03/18/2010   Method used:   [facility]        [facility] [address] [phone]* (retail)       [address]       [place], [place]  [address]       Ph: [phone] or [phone]       Fax: [phone]   RxID:   [id number]

## 2011-01-23 NOTE — Progress Notes (Signed)
Summary: Levothyroxine Refill  Phone Note Refill Request Message from:  Fax from Pharmacy on Apr 27, 2010 5:18 PM  Refills Requested: Medication #1:  LEVOTHYROXINE SODIUM 25 MCG TABS one by mouth qpm   Dosage confirmed as above?Dosage Confirmed   Brand Name Necessary? No   Supply Requested: 1 month   Last Refilled: 04/26/2010  Method Requested: Electronic Next Appointment Scheduled: None Initial call taken by: Glendell Docker CMA,  Apr 27, 2010 5:18 PM  Follow-up for Phone Call        refill x 1.  she needs to come in for repeat TSH within 1 week 244.9 Follow-up by: D. Thomos Lemons DO,  Apr 27, 2010 5:27 PM  Additional Follow-up for Phone Call Additional follow up Details #1::        attempted to contact patient at [phone],no answer voice message left for patient informing patient rx sent to pharmacy, however blood is needed in one week. She was advised to call back and inform me of the location of the blood work Additional Follow-up by: Glendell Docker CMA,  Apr 28, 2010 9:05 AM    Additional Follow-up for Phone Call Additional follow up Details #2::    call placed to patient at [phone], no answer detailed voice message left informing patient blood work would be entered for the [facility] location, if that needed to be changed she was advised to call back and request labs for the [facility] location.  Follow-up by: Glendell Docker CMA,  Apr 28, 2010 3:26 PM  Prescriptions: LEVOTHYROXINE SODIUM 25 MCG TABS (LEVOTHYROXINE SODIUM) one by mouth qpm  #30 x 1   Entered by:   Glendell Docker CMA   Authorized by:   D. Thomos Lemons DO   Signed by:   Glendell Docker CMA on 04/28/2010   Method used:   [facility]        [facility]  [address] [phone][facility] (retail)       [address]       [place], [place]  [address]       Ph: [phone] or [phone]       Fax: [phone]   RxID:   [id number]

## 2011-01-23 NOTE — Assessment & Plan Note (Signed)
Summary: 2 MONTH F/U / TF,CMA   Vital Signs:  Patient profile:   77 year old female Weight:      177.25 pounds BMI:     27.05 O2 Sat:      98 % on Room air Temp:     98.0 degrees F oral Pulse rate:   64 / minute Pulse rhythm:   regular Resp:     20 per minute BP sitting:   88 / 60  (right arm) Cuff size:   98.large  Vitals Entered By: Glendell Docker CMA (June 01, 2010 3:21 PM)  O2 Flow:  Room air CC: Rm 3-  2 Month follow up Is Patient Diabetic? No Comments no  longer taking tessalon pearls, and fluticasone   Primary Care Provider:  DThomos Lemons DO  CC:  Rm 3-  2 Month follow up.  History of Present Illness: 77 y/o AA female for routine f/u overall doing well as per daughter  daughter notes pt c/o pain near her neck scar when washing that area no change in memory.  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  Depression.   History of UTI. Left eye vision loss secondary to retinal hemorrhage, unclear etiology Osteoporosis - S/P Zometa 3.3 mg IV 05/2007  Thyroid nodule  Past Surgical History: Status post hysterectomy 1960s.    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   - MALIGNANT FEATURES ARE NOT IDENTIFIED.  08/2009  Family History: Mother had a history of breast cancer, father of adult onset type 2 diabetes.  No report of heart disease or stroke in the family.         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  She has a total of 3 children, two daughters and one son.   No alcohol.  She quit tobacco approximately 2 years ago.  She was a light smoker, smoked 3 or 4 cigarettes a day for 55 years.         Physical Exam  General:  alert, well-developed, and well-nourished.   Lungs:  normal respiratory effort, normal breath sounds, and no wheezes.   Heart:  normal rate, regular rhythm, and no gallop.   Extremities:  No lower extremity edema  Neurologic:  cranial nerves II-XII intact and gait normal.   Psych:  normally interactive and good eye contact.     Impression & Recommendations:  Problem # 1:  ALZHEIMER'S DISEASE (ICD-331.0) Assessment Unchanged stable.  Maintain current medication regimen.  Complete Medication List: 1)  Namenda 10 Mg Tabs (Memantine hcl) .... Take 1 tablet by mouth two times a day 2)  Klor-con 10 10 Meq Tbcr (Potassium chloride) .... One by mouth once daily 3)  Mirtazapine 15 Mg Tabs (Mirtazapine) .... Take 1 tablet by mouth once a day at bedtime 4)  Simvastatin 20  Mg Tabs (Simvastatin) .... Take 1 tablet by mouth once a day 5)  Aricept 10 Mg Tabs (Donepezil hcl) .... One by mouth qd 6)  Furosemide 20 Mg Tabs (Furosemide) .... Take 1 tablet by mouth once a day 7)  Adult Aspirin Ec Low Strength 81 Mg Tbec (Aspirin) .... Take 1 tablet by mouth once a day 8)  Arimidex 1 Mg Tabs (Anastrozole) .... Take 1 tablet by mouth once a day 9)  Effexor 37.5 Mg Tabs (Venlafaxine hcl) .... Take 1 tablet by mouth two times a day 10)  Vitamin D3 1000 Unit Tabs (Cholecalciferol) .Marland Kitchen.. 1 tablet by mouth once daily 11)  Cvs Calcium-600 600 Mg Tabs (Calcium carbonate) .Marland Kitchen.. 1 tab qd 12)  Imodium A-d 2 Mg Tabs (Loperamide hcl) .Marland Kitchen.. 1 every other day 13)  Levothyroxine Sodium 50 Mcg Tabs (Levothyroxine sodium) .... One by mouth once daily  Patient Instructions: 1)  Please schedule  a follow-up appointment in 6 months. 2)  TSH prior to visit, ICD-9:  244.90 3)  Please return for lab work 2 weeks Prescriptions: EFFEXOR 37.5 MG  TABS (VENLAFAXINE HCL) Take 1 tablet by mouth two times a day  #60 x 5   Entered and Authorized by:   D. Thomos Lemons DO   Signed by:   D. Thomos Lemons DO on 06/01/2010   Method used:   [facility] to        [facility]  [address] [phone]* (retail)       [address]       [place], [place]  [address]       Ph: [phone] or [phone]       Fax: [phone]   RxID:   [id number] FUROSEMIDE 20 MG  TABS (FUROSEMIDE) Take 1 tablet by mouth once a day  #30 Tablet x 5   Entered and Authorized by:   D. Thomos Lemons DO   Signed by:   D. Thomos Lemons DO on 06/01/2010   Method used:   Electronically to        [facility] [phone]* (retail)       [address]       [place], [place]  [address]       Ph: [phone] or [phone]       Fax: [phone]   RxID:   [id number] ARICEPT 10 MG TABS (DONEPEZIL HCL) one by mouth qd  #30 Tablet x 5   Entered and Authorized by:   D. Thomos Lemons DO   Signed by:   D. Thomos Lemons DO on 06/01/2010   Method used:   [facility]ronically to        [facility]  [address] [phone]* (retail)       [address]       [place], [place]  [address]       Ph: [phone] or [phone]       Fax: [phone]   RxID:   [id number] SIMVASTATIN 20 MG  TABS (SIMVASTATIN) Take 1 tablet by mouth once a day  #30 Tablet x 5   Entered and Authorized by:   D. Thomos Lemons DO   Signed by:   D. Thomos Lemons DO on 06/01/2010   Method used:   Electronically to        [facility] [phone]* (retail)  [address]       [place], [place]  [address]       Ph: [phone] or [phone]       Fax: [phone]   RxID:   [id number] MIRTAZAPINE 15 MG  TABS (MIRTAZAPINE) Take 1 tablet by mouth once  a day at bedtime  #30 x 5   Entered and Authorized by:   D. Thomos Lemons DO   Signed by:   D. Thomos Lemons DO on 06/01/2010   Method used:   Electronically to        [facility] [phone]* (retail)       [address]       [place], [place]  [address]       Ph: [phone] or [phone]       Fax: [phone]   RxID:   [id number] KLOR-CON 10 10 MEQ  TBCR (POTASSIUM CHLORIDE) one by mouth once daily  #30 x 5   Entered and Authorized by:   D. Thomos Lemons DO   Signed by:   D. Thomos Lemons DO on 06/01/2010   Method used:   Electronically to        [facility] [phone]* (retail)       [address]       [place], [place]  [address]       Ph: [phone] or [phone]       Fax: [phone]   RxID:   [id number] NAMENDA 10 MG  TABS (MEMANTINE HCL) Take 1 tablet by mouth two times a day  #60 x 5   Entered and Authorized by:   D. Thomos Lemons DO   Signed by:   D. Thomos Lemons DO on 06/01/2010   Method used:   Electronically to        [facility] [phone]* (retail)       [address]       [place], [place]  [address]       Ph: [phone] or [phone]       Fax: [phone]   RxID:   [id number] LEVOTHYROXINE SODIUM 50 MCG TABS (LEVOTHYROXINE SODIUM) one by mouth once daily  #30 x 5   Entered and Authorized by:   D. Thomos Lemons DO   Signed by:   D. Thomos Lemons DO on 06/01/2010   Method used:   Electronically to        [facility] [phone]* (retail)       [address]       [place], [place]  [address]       Ph: [phone] or [phone]       Fax: [phone]   RxID:   [id number]

## 2011-01-23 NOTE — Letter (Signed)
Summary: [facility]  [facility]   Imported By: Lanelle Bal 09/19/2010 09:18:26  _____________________________________________________________________  External Attachment:    Type:   Image     Comment:   External Document

## 2011-01-23 NOTE — Progress Notes (Signed)
Summary: Potassium Refill  Phone Note Refill Request Message from:  Fax from Pharmacy on February 01, 2010 9:59 AM  Refills Requested: Medication #1:  KLOR-CON 10 10 MEQ  TBCR one by mouth once daily   Dosage confirmed as above?Dosage Confirmed   Brand Name Necessary? No   Supply Requested: 1 month   Last Refilled: 11/28/2009  Method Requested: Electronic Next Appointment Scheduled: none Initial call taken by: Roselle Locus,  February 01, 2010 10:00 AM  Follow-up for Phone Call        Rx completed in Dr. Tiajuana Amass Follow-up by: Glendell Docker CMA,  February 01, 2010 10:20 AM    Prescriptions: KLOR-CON 10 10 MEQ  TBCR (POTASSIUM CHLORIDE) one by mouth once daily  #30 x 5   Entered by:   Glendell Docker CMA   Authorized by:   D. Thomos Lemons DO   Signed by:   Glendell Docker CMA on 02/01/2010   Method used:   [facility]ronically to        [facility]  [address] [phone]* (retail)       [address]       [place], [place]  [address]       Ph: [phone] or [phone]       Fax: [phone]   RxID:   [id number]

## 2011-01-23 NOTE — Progress Notes (Signed)
Summary: refill request  Phone Note Refill Request Message from:  Pharmacy on March 20, 2010 8:08 AM  Refills Requested: Medication #1:  SIMVASTATIN 20 MG  TABS Take 1 tablet by mouth once a day   Dosage confirmed as above?Dosage Confirmed   Brand Name Necessary? No   Supply Requested: 1 month   Last [facility]: [facility]/20/[facility] [address] [place] [phone] fax [phone]   Method Requested: Electronic Next Appointment Scheduled: none Initial call taken by: Roselle Locus,  March 20, 2010 8:08 AM    Prescriptions: SIMVASTATIN 20 MG  TABS (SIMVASTATIN) Take 1 tablet by mouth once a day  #30 x 1   Entered by:   Mervin Kung CMA   Authorized by:   Lemont Fillers FNP   Signed by:   Mervin Kung CMA on 03/20/2010   Method used:   Electronically to        [facility]  [address] [phone]* (retail)       [address]       [place], [place]  [address]       Ph: [phone] or [phone]       Fax: [phone]   RxID:   [id number]

## 2011-01-23 NOTE — Assessment & Plan Note (Signed)
Summary: cough congestion/mhf   Vital Signs:  Patient profile:   77 year old female Height:      68 inches Weight:      174 pounds O2 Sat:      98 % on Room air Temp:     98.2 degrees F oral Pulse rate:   72 / minute Pulse rhythm:   regular Resp:     18 per minute BP sitting:   92 / 60  (right arm) Cuff size:   large  Vitals Entered By: Glendell Docker CMA (March 22, 2010 11:49 AM)  O2 Flow:  Room air CC: Rm 2- Cough   Primary Care Provider:  Dondra Spry DO  CC:  Rm 2- Cough.  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 Depression.   History of UTI. Left eye vision loss secondary to retinal hemorrhage, unclear etiology Osteoporosis - S/P Zometa 3.3 mg IV 05/2007  Thyroid nodule  Past Surgical History: Status post hysterectomy 1960s.    THYROID, RIGHT, LOBECTOMY:    - MIXED MICRO- AND MACROFOLLICULAR ADENOMA, 8.5 CM.   - MALIGNANT FEATURES ARE NOT IDENTIFIED.  08/2009  Family History: Mother had a history of breast cancer, father of adult onset type 2 diabetes.  No report of heart disease or stroke in the family.          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  She has a total of 3 children, two daughters and one  son.   No alcohol.  She quit tobacco approximately 2 years ago.  She was a light smoker, smoked 3 or 4 cigarettes a day for 55 years.         Physical Exam  General:  alert, well-developed, and well-nourished.   Head:  normocephalic and atraumatic.   Mouth:  postnasal drip.   Neck:  supple and no masses.   Lungs:  normal respiratory effort, normal breath sounds, and no wheezes.   Heart:  normal rate, regular rhythm, and no gallop.   Neurologic:  cranial nerves II-XII intact.     Impression & Recommendations:  Problem # 1:  COUGH (ICD-786.2) 77 y/o with persistent cough despite 5 days of ceftin.  cxr negative for acute air space dz.  Probable URI or allergic rhinitis.  use nasal steroids and otc claritin as directed.  Patient advised to call office if symptoms persist or worsen.  Orders: T-2 View CXR, Same Day (71020.5TC)  Complete Medication List: 1)  Namenda 10 Mg Tabs (Memantine hcl) .... Take 1 tablet by mouth two times a day 2)  Klor-con 10 10 Meq Tbcr (Potassium chloride) .... One by mouth once daily 3)  Mirtazapine 15 Mg Tabs (Mirtazapine) .Marland KitchenMarland KitchenMarland Kitchen  Take 1 tablet by mouth once a day at bedtime 4)  Simvastatin 20 Mg Tabs (Simvastatin) .... Take 1 tablet by mouth once a day 5)  Aricept 10 Mg Tabs (Donepezil hcl) .... One by mouth qd 6)  Furosemide 20 Mg Tabs (Furosemide) .... Take 1 tablet by mouth once a day 7)  Adult Aspirin Ec Low Strength 81 Mg Tbec (Aspirin) .... Take 1 tablet by mouth once a day 8)  Arimidex 1 Mg Tabs (Anastrozole) .... Take 1 tablet by mouth once a day 9)  Effexor 37.5 Mg Tabs (Venlafaxine hcl) .... Take 1 tablet by mouth two times a day 10)  Vitamin D3 1000 Unit Tabs (Cholecalciferol) .Marland Kitchen.. 1 tablet by mouth once daily 11)  [facility]vs Calcium-600 600 Mg Tabs (Calcium carbonate) .Marland Kitchen.. 1 tab qd 12)  Imodium A-d 2 Mg Tabs (Loperamide hcl) .Marland Kitchen.. 1 every other day 13)  Tessalon Perles 100 Mg Caps (Benzonatate) .... One cap by mouth three times a day as needed cough 14)   Levothyroxine Sodium 25 Mcg Tabs (Levothyroxine sodium) .... One by mouth qpm 15)  Cefuroxime Axetil 500 Mg Tabs (Cefuroxime axetil) .Marland Kitchen.. 1 by mouth 2 times daily x 10 days 16)  Fluticasone Propionate 50 Mcg/act Susp (Fluticasone propionate) .... 2 sprays each nostril once daily  Patient Instructions: 1)  Take claritin 10 mg over the counter once daily. 2)  Use nose spray as directed.  3)  Finish antibiotic. 4)  Call our office if your symptoms do not  improve or gets worse. Prescriptions: FLUTICASONE PROPIONATE 50 MCG/ACT SUSP (FLUTICASONE PROPIONATE) 2 sprays each nostril once daily  #1 x 1   Entered and Authorized by:   D. Thomos Lemons DO   Signed by:   D. Thomos Lemons DO on 03/22/2010   Method used:   Electronically to        [facility]  [address] [phone]* (retail)       [address]       [place], [place]  [address]       Ph: [phone] or [phone]       Fax: [phone]   RxID:   [id number]   Current Allergies (reviewed today): CODEINE PHOSPHATE (CODEINE PHOSPHATE) IBUPROFEN (IBUPROFEN) AVELOX (MOXIFLOXACIN HCL IN NACL)

## 2011-01-23 NOTE — Progress Notes (Signed)
Summary: refill request patient is out   Phone Note Refill Request Message from:  Fax from Pharmacy  Refills Requested: Medication #1:  FUROSEMIDE 20 MG  TABS Take 1 tablet by mouth once a day   Dosage confirmed as above?Dosage Confirmed   Brand Name Necessary? No   Supply Requested: 1 month   Last Refilled: 02/19/2010 patient is out please fill today    Method Requested: Electronic Next Appointment Scheduled: none Initial call taken by: Roselle Locus,  March 22, 2010 1:13 PM    Prescriptions: FUROSEMIDE 20 MG  TABS (FUROSEMIDE) Take 1 tablet by mouth once a day  #30 x 0   Entered by:   Mervin Kung CMA   Authorized by:   D. Thomos Lemons DO   Signed by:   Mervin Kung CMA on 03/22/2010   Method used:   [facility] [phone][facility] (retail[facility]       [address]       [place], [place]  [address]       Ph: [phone] or [phone]       Fax: [phone]   RxID:   [id number]

## 2011-01-23 NOTE — Progress Notes (Signed)
Summary: Mirtazapine Refill  Phone Note Refill Request Message from:  Fax from Pharmacy on February 01, 2010 8:07 AM  Refills Requested: Medication #1:  MIRTAZAPINE 15 MG  TABS Take 1 tablet by mouth once a day at bedtime (OFFICE VISIT NEEDED FOR NEW RX)   Dosage confirmed as above?Dosage Confirmed   Brand Name Necessary? No   Supply Requested: 1 month   Last Refilled: 11/13/2009  Method Requested: Electronic Next Appointment Scheduled: none Initial call taken by: Roselle Locus,  February 01, 2010 8:07 AM  Follow-up for Phone Call        Rx completed in Dr. Tiajuana Amass Follow-up by: Glendell Docker CMA,  February 01, 2010 9:36 AM    New/Updated Medications: MIRTAZAPINE 15 MG  TABS (MIRTAZAPINE) Take 1 tablet by mouth once a day at bedtime Prescriptions: MIRTAZAPINE 15 MG  TABS (MIRTAZAPINE) Take 1 tablet by mouth once a day at bedtime  #30 x 5   Entered by:   Glendell Docker CMA   Authorized by:   D. Thomos Lemons DO   Signed by:   Glendell Docker CMA on 02/01/2010   Method used:   [facility]        [facility] [phone][facility] (retail)       [address]       [place], [place]  [address]       Ph: [phone] or [phone]       Fax: [phone]   RxID:   [id number]

## 2011-01-23 NOTE — Letter (Signed)
[facility]   Imported By: Lanelle Bal 05/15/2010 11:17:32  _____________________________________________________________________  External Attachment:    Type:   Image     Comment:   External Document

## 2011-01-23 NOTE — Letter (Signed)
Summary: [facility]  [facility]   Imported By: Lanelle Bal 05/10/2010 09:04:45  _____________________________________________________________________  External Attachment:    Type:   Image     Comment:   External Document

## 2011-01-23 NOTE — Progress Notes (Signed)
Summary: Namenda Refill  Phone Note Refill Request Message from:  Fax from Pharmacy on February 14, 2010 8:19 AM  Refills Requested: Medication #1:  NAMENDA 10 MG  TABS Take 1 tablet by mouth two times a day   Dosage confirmed as above?Dosage Confirmed   Brand Name Necessary? No   Supply Requested: 1 month   [facility]: [facility] [address]    Method Requested: Electronic Next Appointment Scheduled: NONE Initial call taken by: Roselle Locus,  February 14, 2010 8:20 AM  Follow-up for Phone Call        Rx completed in Dr. Tiajuana Amass Follow-up by: Glendell Docker CMA,  February 14, 2010 11:08 AM    Prescriptions: NAMENDA 10 MG  TABS (MEMANTINE HCL) Take 1 tablet by mouth two times a day  #60 x 5   Entered by:   Glendell Docker CMA   Authorized by:   D. Thomos Lemons DO   Signed by:   Glendell Docker CMA on 02/14/2010   Method used:   [facility]        [facility] [phone]* (retail)       [address]       [place], [place]  [address]       Ph: [phone] or [phone]       Fax: [phone]   RxID:   [id number]

## 2011-01-23 NOTE — Letter (Signed)
[facility]   Imported By: Lanelle Bal 01/17/2010 09:33:08  _____________________________________________________________________  External Attachment:    Type:   Image     Comment:   External Document

## 2011-01-23 NOTE — Assessment & Plan Note (Signed)
Summary: 2 month follow up/mhf rsc with pt/mhf   Vital Signs:  Patient profile:   77 year old female Weight:      171 pounds O2 Sat:      98 % on Room air Temp:     98.0 degrees F oral Pulse rate:   78 / minute Pulse rhythm:   regular Resp:     20 per minute BP sitting:   94 / 60  (right arm) Cuff size:   large  Vitals Entered By: Glendell Docker CMA (January 31, 2010 3:53 PM)  O2 Flow:  Room air  Primary Care Provider:  D. Thomos Lemons DO  CC:  2 Month Follow up .  History of Present Illness: 2 Month Follow up  77 y/o AA female with hx of breast ca, and thyroid nodule for f/u pt seen by oncologist who noted elevated TSH.  pt has mild tenderness at surgical site.  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 Depression.   History of UTI. Left eye vision loss secondary to retinal hemorrhage, unclear etiology Osteoporosis - S/P Zometa 3.3 mg IV 05/2007  Thyroid nodule  Past Surgical History: Status post hysterectomy 1960s.    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   - MALIGNANT FEATURES ARE NOT IDENTIFIED.  08/2009  Family History: Mother had a history of breast cancer, father of adult onset type 2 diabetes.  No report of heart disease or stroke in the family.         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  She has a total of 3 children, two daughters and one son.  No alcohol.  She quit tobacco  approximately 2 years ago.  She was a light smoker, smoked 3 or 4 cigarettes a day for 55 years.         Review of Systems       urinary freq  Physical Exam  General:  alert, well-developed, and well-nourished.   Neck:  supple and no masses.   Lungs:  normal respiratory effort.  decrease breath sounds right base Heart:  normal rate, regular rhythm, and no gallop.     Impression & Recommendations:  Problem # 1:  THYROID NODULE (ICD-241.0) 77 y/o found to have enlarging right thyroid mass with biopsy of follicular neoplasm.  She undewent   Right thyroid lobectomy with isthmusectomy.  08/2009  final pathology 09/20/2009 - THYROID, RIGHT, LOBECTOMY:   - MIXED MICRO- AND MACROFOLLICULAR ADENOMA, 8.5 CM.   - MALIGNANT FEATURES ARE NOT IDENTIFIED.  Monitor TFTs.    Problem # 2:  POLYURIA (ICD-788.42) Pt with freq urination.   rule out hyperglycemia.  Orders: T-Basic Metabolic Panel [id number]) T- Hemoglobin A1C ([id number])  Complete Medication List: 1)  Namenda 10 Mg Tabs (Memantine hcl) .... Take 1 tablet by mouth two times a day 2)  Klor-con 10 10 Meq Tbcr (Potassium chloride) .... One by mouth  once daily 3)  Mirtazapine 15 Mg Tabs (Mirtazapine) .... Take 1 tablet by mouth once a day at bedtime 4)  Simvastatin 20 Mg Tabs (Simvastatin) .... Take 1 tablet by mouth once a day 5)  Aricept 10 Mg Tabs (Donepezil hcl) .... One by mouth qd 6)  Furosemide 20 Mg Tabs (Furosemide) .... Take 1 tablet by mouth once a day 7)  Adult Aspirin Ec Low Strength 81 Mg Tbec (Aspirin) .... Take 1 tablet by mouth once a day 8)  Arimidex 1 Mg Tabs (Anastrozole) .... Take 1 tablet by mouth once a day 9)  Effexor 37.5 Mg Tabs (Venlafaxine hcl) .... Take 1 tablet by mouth two times a day 10)  Vitamin D3 1000 Unit Tabs (Cholecalciferol) .Marland Kitchen.. 1 tablet by mouth once daily 11)  [facility] Calcium-600 600 Mg Tabs (Calcium carbonate) .Marland Kitchen.. 1 tab qd 12)  Imodium A-d 2 Mg Tabs (Loperamide hcl) .Marland Kitchen.. 1 every other  day 13)  Tessalon Perles 100 Mg Caps (Benzonatate) .... One cap by mouth three times a day as needed cough 14)  Levothyroxine Sodium 25 Mcg Tabs (Levothyroxine sodium) .... One by mouth qpm  Patient Instructions: 1)  Please schedule a follow-up appointment in 2 months. 2)  TSH prior to visit, ICD-9: 244.90 3)  Please return for lab work one (1) week before your next appointment.  Prescriptions: LEVOTHYROXINE SODIUM 25 MCG TABS (LEVOTHYROXINE SODIUM) one by mouth qpm  #30 x 2   Entered and Authorized by:   D. Thomos Lemons DO   Signed by:   D. Thomos Lemons DO on 01/31/2010   Method used:   [facility]        [facility] [phone]* (retail)       [address]       [place], [place]  [address]       Ph: [phone] or [phone]       Fax: [phone]   RxID:   [id number]

## 2011-01-25 NOTE — Letter (Signed)
    at [facility] [address] [place], [place]  [address]  [place] Phone: [phone]      December 04, 2010   [facility] [address] [place], [place] [address]  RE:  LAB RESULTS  Dear  Ms. Cdebaca,  The following is an interpretation of your most recent lab tests.  Please take note of any instructions provided or changes to medications that have resulted from your lab work.  ELECTROLYTES:  Good - no changes needed  KIDNEY FUNCTION TESTS:  Good - no changes needed  LIVER FUNCTION TESTS:  Good - no changes needed  LIPID PANEL:  Fair - review at your next visit Triglyceride: 306   Cholesterol: 179   LDL: 61   HDL: 57   Chol/HDL%:  3.1 Ratio  THYROID STUDIES:  Thyroid studies normal TSH: 1.209       Triglycerides elevated due to non fasting blood draw.       Sincerely Yours,    Dr. Thomos Lemons  Appended Document:  mailed

## 2011-01-25 NOTE — Progress Notes (Signed)
Summary: Lab Results  Phone Note Outgoing Call   Summary of Call: call pt - thyroid blood test normal continue same dose of thyroid medication repeat TSH in 6 months  Initial call taken by: D. Thomos Lemons DO,  December 04, 2010 1:19 PM  Follow-up for Phone Call        call placed to patients daughter Armando Gang at [phone], she has been informed of patient results per Dr Artist Pais instructions. She states she will have the 6 month blood work done in [place] at the [facility] Follow-up by: Glendell Docker CMA,  December 04, 2010 2:32 PM    Prescriptions: LEVOTHYROXINE SODIUM 50 MCG TABS (LEVOTHYROXINE SODIUM) one by mouth once daily  #90 x 1   Entered and Authorized by:   D. Thomos Lemons DO   Signed by:   D. Thomos Lemons DO on 12/04/2010   Method used:   [facility]        [facility] [phone]* (retail)       [address]       [place], [place]  [address]       Ph: [phone] or [phone]       Fax: [phone]   RxID:   [id number]

## 2011-01-25 NOTE — Assessment & Plan Note (Signed)
Summary: 6 month follow up/mhf   Vital Signs:  Patient profile:   77 year old female Height:      68 inches Weight:      180.50 pounds BMI:     27.54 O2 Sat:      98 % on Room air Temp:     98.4 degrees F oral Pulse rate:   72 / minute Resp:     20 per minute BP sitting:   104 / 60  (right arm) Cuff size:   large  Vitals Entered By: Glendell Docker CMA (December 01, 2010 4:00 PM)  O2 Flow:  Room air CC: 6 Month Follow up  Is Patient Diabetic? No Pain Assessment Patient in pain? no      Comments no concerns, request refills on all medications     Last PAP Result Declined   Primary Care Provider:  Dondra Spry DO  CC:  6 Month Follow up .  History of Present Illness: 77 y/o AA female with hx advanced dementia, and breast ca for f/u doing well pt and her daughter had good visit with family in [place]  demenita - no sign change.  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  Depression.    History of UTI. Left eye vision loss secondary to retinal hemorrhage, unclear etiology Osteoporosis - S/P Zometa 3.3 mg IV 05/2007  Thyroid nodule  Past Surgical History: Status post hysterectomy 1960s.    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   - MALIGNANT FEATURES ARE NOT IDENTIFIED.  08/2009   Family History: Mother had a history of breast cancer, father of adult  onset type 2 diabetes.  No report of heart disease or stroke in the family.            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  She has a total of 3 children, two daughters and one son.   No alcohol.  She quit tobacco approximately 2 years ago.  She was a light smoker, smoked 3 or 4 cigarettes a day for 55 years.         Physical Exam  General:  alert, well-developed, and well-nourished.   Neck:  supple and no masses.   Lungs:  normal respiratory effort, normal breath sounds, and no wheezes.   Heart:  normal rate, regular rhythm, and no gallop.   Extremities:  No lower extremity edema  Neurologic:  cranial nerves II-XII intact and gait normal.     Impression & Recommendations:  Problem # 1:  ALZHEIMER'S DISEASE (ICD-331.0) Assessment Unchanged continue namenda and aricept  Problem # 2:  HYPOTHYROIDISM (ICD-244.9) Assessment: Unchanged  Her updated medication list for this problem includes:    Levothyroxine Sodium 50 Mcg Tabs (Levothyroxine sodium) ..... One by mouth once daily  Orders: T-TSH (  [phone])  Labs Reviewed: TSH: 2.00 (05/08/2010)   Total T4: 5.9 (05/01/2007)    HgBA1c: 6.1 (01/31/2010) Chol: 158 (06/16/2009)   HDL: 61 (06/16/2009)   LDL: 56 (06/16/2009)   TG: 205 (06/16/2009)  Complete Medication List: 1)  Namenda 10 Mg Tabs (Memantine hcl) .... Take 1 tablet by mouth two times a day 2)  Klor-con 10 10 Meq Tbcr (Potassium chloride) .... One by mouth once daily 3)  Mirtazapine 15 Mg Tabs (Mirtazapine) .... Take 1/2  tablet by mouth once a day at bedtime 4)  Simvastatin 20 Mg Tabs (Simvastatin) .... Take 1 tablet by mouth once a day 5)  Aricept 10 Mg Tabs (Donepezil hcl) .... One by mouth qd 6)  Furosemide 20 Mg Tabs (Furosemide) .... Take 1 tablet by mouth once a day 7)  Adult Aspirin Ec Low Strength 81 Mg Tbec (Aspirin) .... Take 1 tablet by mouth once a day 8)  Arimidex 1 Mg Tabs  (Anastrozole) .... Take 1 tablet by mouth once a day 9)  Effexor 37.5 Mg Tabs (Venlafaxine hcl) .... Take 1 tablet by mouth two times a day 10)  Vitamin D3 1000 Unit Tabs (Cholecalciferol) .Marland Kitchen.. 1 tablet by mouth once daily 11)  [facility] Calcium-600 600 Mg Tabs (Calcium carbonate) .Marland Kitchen.. 1 tab qd 12)  Imodium A-d 2 Mg Tabs (Loperamide hcl) .Marland Kitchen.. 1 every other day 13)  Levothyroxine Sodium 50 Mcg Tabs (Levothyroxine sodium) .... One by mouth once daily  Other Orders: T-Basic Metabolic Panel [id number]) T-Hepatic Function [phone]) T-Lipid Profile [id number])  Patient Instructions: 1)  Please schedule a follow-up appointment in 6 months. Prescriptions: MIRTAZAPINE 15 MG  TABS (MIRTAZAPINE) Take 1/2  tablet by mouth once a day at bedtime  #30 x 5   Entered and Authorized by:   D. Thomos Lemons DO   Signed by:   D. Thomos Lemons DO on 12/01/2010   Method used:   [facility]        [facility] [phone]* (retail)       [address]       [place], [place]  [address]       Ph: [phone] or [phone]       Fax: [phone]   RxID:   [id number] EFFEXOR 37.5 MG  TABS (VENLAFAXINE HCL) Take 1 tablet by mouth two times a day  #60 x 5   Entered and Authorized by:   D. Thomos Lemons DO   Signed by:   D. Thomos Lemons DO on 12/01/2010   Method used:   [facility] to        [facility] [phone]* (retail)       [address]       [place], [place]  [address]       Ph: [phone] or [phone]       Fax: [phone]   RxID:   [id number] FUROSEMIDE 20 MG  TABS (FUROSEMIDE) Take 1 tablet by mouth once a day  #30 Tablet x 5   Entered and Authorized by:   D. Thomos Lemons DO   Signed by:   D. Thomos Lemons DO on 12/01/2010   Method used:   [facility]        [facility] [address] [phone]* (retail)       [address]       [place]  [place], [place]  [address]       Ph: [phone] or  [phone]       Fax: [phone]   RxID:   [id number] ARICEPT 10 MG TABS (DONEPEZIL HCL) one by mouth qd  #30 Tablet x 5   Entered and Authorized by:   D. Thomos Lemons DO   Signed by:   D. Thomos Lemons DO on 12/01/2010   Method used:   [facility]ronically to        [facility] [phone]* (retail)       [address]       [place], [place]  [address]       Ph: [phone] or [phone]       Fax: [phone]   RxID:   [id number] SIMVASTATIN 20 MG  TABS (SIMVASTATIN) Take 1 tablet by mouth once a day  #30 Tablet x 5   Entered and Authorized by:   D. Thomos Lemons DO   Signed by:   D. Thomos Lemons DO on 12/01/2010   Method used:   [facility]ronically to        [facility] [facility] [address] [phone]* (retail)       [address]       [place], [place]  [address]       Ph: [phone] or [phone]       Fax: [phone]   RxID:   [id number] KLOR-CON 10 10 MEQ  TBCR (POTASSIUM CHLORIDE) one by mouth once daily  #30 x 5   Entered and Authorized by:   D. Thomos Lemons DO   Signed by:   D. Thomos Lemons DO on 12/01/2010   Method used:   Electronically to        [facility] [facility] [address] [phone]* (retail)       [address]       [place], [place]  [address]       Ph: [phone] or [phone]       Fax: [phone]   RxID:   [id number] NAMENDA 10 MG  TABS (MEMANTINE HCL) Take 1 tablet by mouth two times a day  #60 x 5   Entered and Authorized by:   D. Thomos Lemons DO   Signed by:   D. Thomos Lemons DO on 12/01/2010   Method used:   [facility]ronically to        [facility] [address] [phone]* (retail)       [address]       [place], [place]  [address]       Ph: [phone] or [phone]       Fax: [phone]   RxID:   [id number]    Orders Added: 1)  T-Basic Metabolic Panel [phone] 2)  T-Hepatic Function [phone] 3)  T-Lipid Profile [[id number]] 4)  T-TSH  [[id number]] 5)  Est. Patient Level III [[address]]   Immunization History:  Influenza Immunization History:    Influenza:  historical (10/03/2010)   Immunization History:  Influenza Immunization History:    Influenza:  Historical (10/03/2010)    Preventive Care Screening  Pap Smear:    Date:  12/01/2010    Results:  Declined  Last Flu Shot:    Date:  10/03/2010    Results:  Historical   Mammogram:    Date:  05/16/2010    Results:  normal

## 2011-02-02 ENCOUNTER — Encounter: Payer: Self-pay | Admitting: Internal Medicine

## 2011-02-02 ENCOUNTER — Other Ambulatory Visit (HOSPITAL_COMMUNITY): Payer: Self-pay | Admitting: Oncology

## 2011-02-02 ENCOUNTER — Encounter (HOSPITAL_BASED_OUTPATIENT_CLINIC_OR_DEPARTMENT_OTHER): Payer: BC Managed Care – PPO | Admitting: Oncology

## 2011-02-02 DIAGNOSIS — M810 Age-related osteoporosis without current pathological fracture: Secondary | ICD-10-CM

## 2011-02-02 DIAGNOSIS — C50319 Malignant neoplasm of lower-inner quadrant of unspecified female breast: Secondary | ICD-10-CM

## 2011-02-02 LAB — LACTATE DEHYDROGENASE: LDH: 168 U/L (ref 94–250)

## 2011-02-02 LAB — CBC WITH DIFFERENTIAL/PLATELET
EOS%: 2.5 % (ref 0.0–7.0)
HCT: 38.4 % (ref 34.8–46.6)
LYMPH%: 27.4 % (ref 14.0–49.7)
MCV: 92.6 fL (ref 79.5–101.0)
MONO%: 8.8 % (ref 0.0–14.0)
RBC: 4.15 10*6/uL (ref 3.70–5.45)

## 2011-02-02 LAB — COMPREHENSIVE METABOLIC PANEL
AST: 18 U/L (ref 0–37)
Albumin: 4.1 g/dL (ref 3.5–5.2)
BUN: 8 mg/dL (ref 6–23)
Calcium: 9.5 mg/dL (ref 8.4–10.5)
Chloride: 103 mEq/L (ref 96–112)
Glucose, Bld: 145 mg/dL — ABNORMAL HIGH (ref 70–99)
Potassium: 3.8 mEq/L (ref 3.5–5.3)

## 2011-02-20 NOTE — Letter (Signed)
Summary: [facility]  [facility]   Imported By: Maryln Gottron 02/15/2011 15:27:28  _____________________________________________________________________  External Attachment:    Type:   Image     Comment:   External Document

## 2011-03-16 ENCOUNTER — Telehealth: Payer: Self-pay | Admitting: Internal Medicine

## 2011-03-16 DIAGNOSIS — G309 Alzheimer's disease, unspecified: Secondary | ICD-10-CM

## 2011-03-16 NOTE — Telephone Encounter (Signed)
Pt daughter Stanton Kidney called stating that pt was taking 1/2 a tab of mirtazapine a day but would like to go back to 1 tablet a day. Pt not sleeping well on only 1/2 tab. Please assist.

## 2011-03-19 ENCOUNTER — Other Ambulatory Visit: Payer: Self-pay | Admitting: *Deleted

## 2011-03-19 MED ORDER — MIRTAZAPINE 15 MG PO TABS: 15.0000 mg | ORAL_TABLET | Freq: Every day | ORAL | Status: DC

## 2011-03-19 MED ORDER — DONEPEZIL HCL 5 MG PO TABS: 10.0000 mg | ORAL_TABLET | Freq: Every evening | ORAL | Status: DC | PRN

## 2011-03-19 NOTE — Telephone Encounter (Signed)
Addended by: Mervin Kung on: 03/19/2011 04:30 PM   Modules accepted: Orders

## 2011-03-19 NOTE — Telephone Encounter (Deleted)
Dr Artist Pais, pt's daughter Stanton Kidney states that pt has been taking 1 tablet of Mirtazapine at bedtime instead of 1/2 tablet. Pt doing well on 1 tablet and daughter would like to get refill for 1 tablet at bedtime. Please advise.

## 2011-03-19 NOTE — Telephone Encounter (Signed)
Pt's daughter Carrie Stephens states that pt has been taking 1 tablet of Mirtazapine at bedtime instead of 1/2 tablet. Pt doing well on 1 tablet and daughter would like to get refill for 1 tablet at bedtime. Please advise.

## 2011-03-19 NOTE — Telephone Encounter (Signed)
rx sent to pharmacy

## 2011-03-19 NOTE — Telephone Encounter (Signed)
Per Dr Artist Pais, ok to continue Mirtazapine 15mg  1 tablet at bedtime. Rx refilled.  Notified pt's daughter.

## 2011-03-19 NOTE — Telephone Encounter (Signed)
OK to resume full dose of mirtazapine

## 2011-03-26 ENCOUNTER — Telehealth: Payer: Self-pay | Admitting: *Deleted

## 2011-03-26 DIAGNOSIS — G309 Alzheimer's disease, unspecified: Secondary | ICD-10-CM

## 2011-03-26 MED ORDER — DONEPEZIL HCL 10 MG PO TABS: 10.0000 mg | ORAL_TABLET | Freq: Every day | ORAL | Status: DC

## 2011-03-26 NOTE — Telephone Encounter (Signed)
Patients daughter Armando Gang called and left  Voice message stating patients insurance will not cover her medication Aricept as prescribed 5mg  twice a day. She states the prescriptions will need to be re-written to state 10mg  daily.   [facility], [facility]ist states patients daughter picked up rx for 5mg  twice daily, however the change may be in place for April. A new rx sent to pharmacy for 10mg  daily with 3 rf.

## 2011-03-27 LAB — RETICULOCYTES
RBC.: 3.58 MIL/uL — ABNORMAL LOW (ref 3.87–5.11)
Retic Count, Absolute: 32.2 10*3/uL (ref 19.0–186.0)

## 2011-03-27 LAB — COMPREHENSIVE METABOLIC PANEL
Albumin: 3.1 g/dL — ABNORMAL LOW (ref 3.5–5.2)
Alkaline Phosphatase: 78 U/L (ref 39–117)
BUN: 18 mg/dL (ref 6–23)
Calcium: 9.2 mg/dL (ref 8.4–10.5)
Creatinine, Ser: 1.28 mg/dL — ABNORMAL HIGH (ref 0.4–1.2)
Potassium: 3.3 mEq/L — ABNORMAL LOW (ref 3.5–5.1)
Total Protein: 7.1 g/dL (ref 6.0–8.3)

## 2011-03-27 LAB — BASIC METABOLIC PANEL
BUN: 16 mg/dL (ref 6–23)
CO2: 25 mEq/L (ref 19–32)
CO2: 26 mEq/L (ref 19–32)
Calcium: 8.9 mg/dL (ref 8.4–10.5)
Calcium: 9 mg/dL (ref 8.4–10.5)
Creatinine, Ser: 1.1 mg/dL (ref 0.4–1.2)
GFR calc non Af Amer: 45 mL/min — ABNORMAL LOW (ref >60)
GFR calc non Af Amer: 47 mL/min — ABNORMAL LOW (ref >60)
Glucose, Bld: 105 mg/dL — ABNORMAL HIGH (ref 70–99)
Glucose, Bld: 115 mg/dL — ABNORMAL HIGH (ref 70–99)
Potassium: 4 mEq/L (ref 3.5–5.1)
Sodium: 140 mEq/L (ref 135–145)

## 2011-03-27 LAB — URINALYSIS, ROUTINE W REFLEX MICROSCOPIC
Glucose, UA: NEGATIVE mg/dL
Hgb urine dipstick: NEGATIVE
Leukocytes, UA: NEGATIVE
Specific Gravity, Urine: 1.022 (ref 1.005–1.030)
Urobilinogen, UA: 0.2 mg/dL (ref 0.0–1.0)

## 2011-03-27 LAB — CULTURE, BLOOD (ROUTINE X 2)
Culture: NO GROWTH
Culture: NO GROWTH

## 2011-03-27 LAB — DIFFERENTIAL
Lymphocytes Relative: 14 % (ref 12–46)
Lymphs Abs: 1.8 10*3/uL (ref 0.7–4.0)
Monocytes Absolute: 1.5 10*3/uL — ABNORMAL HIGH (ref 0.1–1.0)
Monocytes Relative: 12 % (ref 3–12)
Neutro Abs: 9.5 10*3/uL — ABNORMAL HIGH (ref 1.7–7.7)

## 2011-03-27 LAB — CBC
HCT: 30.4 % — ABNORMAL LOW (ref 36.0–46.0)
HCT: 33.5 % — ABNORMAL LOW (ref 36.0–46.0)
Hemoglobin: 10.3 g/dL — ABNORMAL LOW (ref 12.0–15.0)
Hemoglobin: 10.6 g/dL — ABNORMAL LOW (ref 12.0–15.0)
MCHC: 33.3 g/dL (ref 30.0–36.0)
MCHC: 34 g/dL (ref 30.0–36.0)
MCHC: 34 g/dL (ref 30.0–36.0)
Platelets: 307 10*3/uL (ref 150–400)
Platelets: 347 10*3/uL (ref 150–400)
Platelets: 352 10*3/uL (ref 150–400)
RDW: 13.1 % (ref 11.5–15.5)
RDW: 13.1 % (ref 11.5–15.5)
RDW: 13.5 % (ref 11.5–15.5)

## 2011-03-27 LAB — LEGIONELLA ANTIGEN, URINE: Legionella Antigen, Urine: NEGATIVE

## 2011-03-27 LAB — IRON AND TIBC: UIBC: 299 ug/dL

## 2011-03-27 LAB — FOLATE: Folate: 10 ng/mL

## 2011-03-27 LAB — TSH: TSH: 7.589 u[IU]/mL — ABNORMAL HIGH (ref 0.350–4.500)

## 2011-03-27 LAB — CLOSTRIDIUM DIFFICILE EIA

## 2011-03-27 LAB — FERRITIN: Ferritin: 115 ng/mL (ref 10–291)

## 2011-03-27 LAB — URINE MICROSCOPIC-ADD ON

## 2011-03-27 LAB — URINE CULTURE

## 2011-03-30 LAB — BASIC METABOLIC PANEL
BUN: 8 mg/dL (ref 6–23)
CO2: 31 mEq/L (ref 19–32)
Calcium: 9.3 mg/dL (ref 8.4–10.5)
Chloride: 105 mEq/L (ref 96–112)
Creatinine, Ser: 1.23 mg/dL — ABNORMAL HIGH (ref 0.4–1.2)
Glucose, Bld: 87 mg/dL (ref 70–99)

## 2011-03-30 LAB — CBC
MCHC: 33.4 g/dL (ref 30.0–36.0)
MCV: 94.1 fL (ref 78.0–100.0)
Platelets: 315 10*3/uL (ref 150–400)
RBC: 4.05 MIL/uL (ref 3.87–5.11)
RDW: 13.4 % (ref 11.5–15.5)

## 2011-04-11 ENCOUNTER — Other Ambulatory Visit: Payer: Self-pay | Admitting: Family Medicine

## 2011-04-12 ENCOUNTER — Other Ambulatory Visit (HOSPITAL_COMMUNITY): Payer: Self-pay | Admitting: Oncology

## 2011-04-12 ENCOUNTER — Encounter (HOSPITAL_BASED_OUTPATIENT_CLINIC_OR_DEPARTMENT_OTHER): Payer: BC Managed Care – PPO | Admitting: Oncology

## 2011-04-12 DIAGNOSIS — M810 Age-related osteoporosis without current pathological fracture: Secondary | ICD-10-CM

## 2011-04-12 LAB — BASIC METABOLIC PANEL
Calcium: 10 mg/dL (ref 8.4–10.5)
Glucose, Bld: 102 mg/dL — ABNORMAL HIGH (ref 70–99)
Potassium: 4.2 mEq/L (ref 3.5–5.3)
Sodium: 145 mEq/L (ref 135–145)

## 2011-04-16 ENCOUNTER — Other Ambulatory Visit: Payer: Self-pay | Admitting: Family Medicine

## 2011-05-11 NOTE — Assessment & Plan Note (Signed)
[facility]                           PRIMARY CARE OFFICE NOTE   NAME:Carrie Stephens, Carrie Stephens                       MRN:          [id number]  DATE:04/17/2007                            DOB:          1920/12/21    CHIEF COMPLAINT:  New patient to practice.   HISTORY OF PRESENT ILLNESS:  The patient is an 77 year old African  American female here to establish primary care.  She was previously  followed by Dr. Valentina Lucks but due to insurance reasons is switching  primary care offices.  She is also followed by Dr. Arline Asp for history  of breast cancer.  She underwent lumpectomy in 2007, is status post  radiation, and is currently on adjuvant therapy with Arimidex.   She is accompanied by her daughter today who provided most of the  history.  She has a history of Alzheimer's disease/memory loss.  She has  been treated for the last 5 years, initially with Aricept, then later  with Namenda.  She is still able to perform most activities of daily  living including feeding herself, dressing herself, and general personal  hygiene.  She has a history of depression and is currently on Remeron 15  mg p.o. nightly.  Daughter does note some daytime somnolence.  She  sleeps through the night, also takes several naps during the day.   PAST MEDICAL HISTORY SUMMARY:  1. Breast cancer, status post lumpectomy left side, February of 2007.  2. Alzheimer's disease.  3. Hyperlipidemia.  4. Depression.  5. History of UTI.  6. Status post hysterectomy 1960s.  7. 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   Her specialists include Dr. Arline Asp - Oncology and is followed by Dr.  Ashley Royalty, Ophthalmologist.   CURRENT MEDICATIONS:  1. Calcium with vitamin D 600 mg one twice a day.  2. Aspirin 81 mg once a day.  3. Arimidex 1 mg once a day.  4. Klor-Con 10 mEq once a day.  5. Furosemide 20 mg once a day.  6. Namenda 10 mg b.i.d.  7. Mirtazapine 15  mg nightly.  8. Simvastatin 20 mg once a day.  9. Aricept 10 mg once a day.   ALLERGIES TO MEDICATIONS:  INCLUDE IBUPROFEN, CODEINE WHICH CAUSES  CONFUSION, AND VENALYN (OLD COUGH MEDICINE) UNKNOWN REACTION.   SOCIAL HISTORY:  Patient is widowed, is the retired Psychologist, forensic,  currently lives with her daughter.  She has a total of 3 children, two  daughters and one son.   FAMILY HISTORY:  Mother had a history of breast cancer, father of adult  onset type 2 diabetes.  No report of heart disease or stroke in the  family.   HABITS:  No alcohol.  She quit tobacco approximately 2 years ago.  She  was a light smoker, smoked 3 or 4 cigarettes a day for 55 years.   REVIEW OF SYSTEMS:  No chest pain.  The patient has mild hearing loss as  noted by daughter.  No changes in vision.  Denies cough, shortness of  breath.  No heartburn, nausea, vomiting, constipation, diarrhea.  She  has had screening colonoscopy, last one in 1998.  No musculoskeletal  complaints.  All systems negative.   PHYSICAL EXAMINATION:  VITAL SIGNS:  Weight is 184 pounds, temperature  is 97, pulse is 53, blood pressure is 119/65.  GENERAL:  The patient is pleasant, somewhat overweight 77 year old  [place] female, appears her stated age.  HEENT:  Normocephalic/atraumatic, pupils were equal and reactive to  light bilaterally, evidence of right iridectomy, external auditory  canals showed bilateral cerumen.  Oropharyngeal exam revealed upper and  lower dental plate, otherwise unremarkable.  NECK:  Supple, no adenopathy, carotid bruit, or thyromegaly.  CHEST:  Normal respiratory effort, chest was clear to auscultation  bilaterally; no rhonchi, rales, or wheezing.  CARDIOVASCULAR:  Regular rate and rhythm.  No significant murmurs, rubs,  or gallops appreciated.  ABDOMEN:  Protuberant but nontender, positive bowel sounds, no  organomegaly.  MUSCULOSKELETAL:  No clubbing, cyanosis, or edema.  The patient had   diminished pedis dorsalis pulses, intact posterior tibial pulses.  NEUROLOGIC:  Cranial nerves II-XII was grossly intact, she ambulates  without need for assistive devices.   IMPRESSION:  1. Dementia, presumed Alzheimer's type.  2. History of breast cancer, status post left lumpectomy.  Currently      on Arimidex.  3. Hyperlipidemia.  4. History of depression.  5. Health maintenance.   RECOMMENDATIONS:  Patient will be sent for followup labs regarding her  cholesterol status and LFTs.  If patient has issues with somnolence I  recommended decreasing her Remeron dose to 1/2 tablet at bedtime.   She is to continue her Aricept and Namenda.  We will check B-12, folate,  and thyroid function studies as well as RPR and arrange followup in June  of 2008.     Barbette Hair. Artist Pais, DO  Electronically Signed    RDY/MedQ  DD: 04/17/2007  DT: 04/17/2007  Job #: [id number]

## 2011-05-11 NOTE — Op Note (Signed)
[facility]  Patient:    Carrie Stephens, Carrie Stephens                       MRN: [id number] Proc. Date: 10/10/00 Adm. Date:  [id number] Attending:  Lindaann Slough                           Operative Report  PREOPERATIVE DIAGNOSIS:  Gross hematuria, rule out renal pelvis tumor.  POSTOPERATIVE DIAGNOSIS:  Gross hematuria, no renal pelvis tumor.  PROCEDURE:  Cystoscopy, right retrograde pyelogram.  SURGEON:  Lindaann Slough, M.D.  ANESTHESIA:  General.  INDICATION:  The patient is a 77 year old female who had episodes of gross painless hematuria.  An IVP showed normal left kidney and a questionable filling defect in the collecting system of the right kidney.  She is scheduled for a cystoscopy and right retrograde pyelogram and ureteroscopy if indicated.  DESCRIPTION OF PROCEDURE:  Under general anesthesia, the patient was prepped and draped and placed in the dorsal lithotomy position.  A #23 Wappler cystoscope was inserted into the bladder.  The bladder mucosa is normal. There is no stone or tumor in the bladder.  The ureteral orifices are in normal position and shape with clear efflux.  A cone-tip catheter was passed in the bladder and into the right ureteral orifice.  Contrast was then injected through the ureteral catheter.  The ureter is normal.  The collecting system is normal.  There is no evidence of filling defect in the ureter nor in the collecting system.  There is an extrinsic defect at the level of the upper pole of the right kidney compatible with a coursing vessel.  The cone-tip catheter was then removed.  An open-end catheter was then passed into the right ureter into the collecting system, and the renal pelvis was then irrigated with normal saline and washings were sent for pathology.  The ureteral catheter was then removed.  The bladder was emptied and the cystoscope removed.  The patient tolerated the procedure well and left the OR in  satisfactory condition to the postanesthesia care unit. DD:  10/10/00 TD:  10/10/00 Job: [id number]

## 2011-06-06 ENCOUNTER — Telehealth: Payer: Self-pay | Admitting: Internal Medicine

## 2011-06-06 NOTE — Telephone Encounter (Signed)
Rx refilled in April 2012, no refills needed at this time

## 2011-06-06 NOTE — Telephone Encounter (Signed)
Refill- donepezil hcl 5mg  tablet. Take 2 tablets by mouth at bedtime as needed. Qty 30. Date written 3.26.12

## 2011-06-11 ENCOUNTER — Other Ambulatory Visit: Payer: Self-pay

## 2011-06-11 ENCOUNTER — Other Ambulatory Visit: Payer: Self-pay | Admitting: Internal Medicine

## 2011-06-11 DIAGNOSIS — E039 Hypothyroidism, unspecified: Secondary | ICD-10-CM

## 2011-06-12 ENCOUNTER — Other Ambulatory Visit (HOSPITAL_COMMUNITY): Payer: Self-pay | Admitting: Oncology

## 2011-06-12 ENCOUNTER — Encounter (HOSPITAL_BASED_OUTPATIENT_CLINIC_OR_DEPARTMENT_OTHER): Payer: BC Managed Care – PPO | Admitting: Oncology

## 2011-06-12 DIAGNOSIS — Z853 Personal history of malignant neoplasm of breast: Secondary | ICD-10-CM

## 2011-06-12 DIAGNOSIS — C50319 Malignant neoplasm of lower-inner quadrant of unspecified female breast: Secondary | ICD-10-CM

## 2011-06-12 DIAGNOSIS — M810 Age-related osteoporosis without current pathological fracture: Secondary | ICD-10-CM

## 2011-06-12 LAB — COMPREHENSIVE METABOLIC PANEL
Albumin: 4 g/dL (ref 3.5–5.2)
Alkaline Phosphatase: 55 U/L (ref 39–117)
BUN: 10 mg/dL (ref 6–23)
Glucose, Bld: 115 mg/dL — ABNORMAL HIGH (ref 70–99)
Potassium: 3.6 mEq/L (ref 3.5–5.3)
Total Bilirubin: 0.5 mg/dL (ref 0.3–1.2)

## 2011-06-12 LAB — CBC WITH DIFFERENTIAL/PLATELET
Basophils Absolute: 0 10*3/uL (ref 0.0–0.1)
Eosinophils Absolute: 0.1 10*3/uL (ref 0.0–0.5)
HCT: 37.3 % (ref 34.8–46.6)
HGB: 12.5 g/dL (ref 11.6–15.9)
LYMPH%: 37.9 % (ref 14.0–49.7)
MCV: 90.5 fL (ref 79.5–101.0)
MONO%: 10.2 % (ref 0.0–14.0)
NEUT#: 2.5 10*3/uL (ref 1.5–6.5)
Platelets: 231 10*3/uL (ref 145–400)

## 2011-06-21 ENCOUNTER — Telehealth: Payer: Self-pay | Admitting: Internal Medicine

## 2011-06-21 MED ORDER — LEVOTHYROXINE SODIUM 50 MCG PO TABS: 50.0000 ug | ORAL_TABLET | Freq: Every day | ORAL | Status: DC

## 2011-06-21 NOTE — Telephone Encounter (Signed)
Refill- levothyroxine tablet. One by mouth once daily. Qty 90. Last fill 3.21.12

## 2011-06-21 NOTE — Telephone Encounter (Signed)
Rx refill sent to pharmacy; patient is due for follow up last office visit 12/03/2010

## 2011-06-22 ENCOUNTER — Encounter: Payer: Self-pay | Admitting: Internal Medicine

## 2011-06-28 ENCOUNTER — Telehealth: Payer: Self-pay | Admitting: Internal Medicine

## 2011-06-28 NOTE — Telephone Encounter (Signed)
Refill- klor con tablet. Take one tablet daily. Qty 30 last fill 5.29.12

## 2011-06-29 MED ORDER — POTASSIUM CHLORIDE CRYS ER 10 MEQ PO TBCR: 10.0000 meq | EXTENDED_RELEASE_TABLET | Freq: Every day | ORAL | Status: DC

## 2011-06-29 NOTE — Telephone Encounter (Signed)
Rx refill sent to pharmacy. 

## 2011-07-02 ENCOUNTER — Telehealth: Payer: Self-pay | Admitting: Internal Medicine

## 2011-07-02 DIAGNOSIS — E785 Hyperlipidemia, unspecified: Secondary | ICD-10-CM

## 2011-07-02 MED ORDER — SIMVASTATIN 20 MG PO TABS: 20.0000 mg | ORAL_TABLET | Freq: Every day | ORAL | Status: DC

## 2011-07-02 NOTE — Telephone Encounter (Signed)
30 day rx sent.  Pt is due for 6 month follow up.  No appts on file at this time.

## 2011-07-02 NOTE — Telephone Encounter (Signed)
Refill-simvastatin 20mg  tablet. Take 1 tablet by mouth every day. Qty 30. Last fill 1.6.12

## 2011-07-03 ENCOUNTER — Telehealth: Payer: Self-pay | Admitting: Internal Medicine

## 2011-07-03 MED ORDER — MEMANTINE HCL 10 MG PO TABS: 10.0000 mg | ORAL_TABLET | Freq: Two times a day (BID) | ORAL | Status: DC

## 2011-07-03 NOTE — Telephone Encounter (Signed)
Pt's daughter returned my call and states that pt is visiting her son during the month of July and part of August and will be returning to [place]. Refill sent to pharmacy.

## 2011-07-03 NOTE — Telephone Encounter (Signed)
Refill- namenda 10mg  tablet. Take one tablet by mouth twice a day. Qty 60. Last fill 11.1.2011

## 2011-07-03 NOTE — Telephone Encounter (Signed)
Left message on daughter's cell phone to clarify if pt is visiting in [place] or if she has moved. Namenda 10mg  #60 x no refills has been [facility] [place].

## 2011-07-03 NOTE — Telephone Encounter (Signed)
Please call medication  Namenda 10mg     [facility]   [facility] [place]   [phone]  Pt is now in [place]

## 2011-07-18 ENCOUNTER — Other Ambulatory Visit: Payer: Self-pay | Admitting: Internal Medicine

## 2011-07-18 NOTE — Telephone Encounter (Signed)
Left message for patient to return my call.

## 2011-07-18 NOTE — Telephone Encounter (Signed)
30 day supply Effexor sent to pharmacy. Pt last seen by Dr Artist Pais in 12/11 and advised f/u in 6 months. Pt past due for f/u. Please call pt to arrange.

## 2011-07-20 ENCOUNTER — Encounter: Payer: Self-pay | Admitting: Internal Medicine

## 2011-07-24 NOTE — Telephone Encounter (Signed)
Spoke to patient's daughter Stanton Kidney. Patient is in [place] right now and will not return until last august. Debra made appt for patient on 08-31-11

## 2011-08-01 ENCOUNTER — Telehealth: Payer: Self-pay | Admitting: Internal Medicine

## 2011-08-01 DIAGNOSIS — G309 Alzheimer's disease, unspecified: Secondary | ICD-10-CM

## 2011-08-01 DIAGNOSIS — E785 Hyperlipidemia, unspecified: Secondary | ICD-10-CM

## 2011-08-01 NOTE — Telephone Encounter (Signed)
[place] from [facility] [address], [place] called stating that patient is out of simvastatin, donepezil, namenda, klor-con, venlafaxine, mirtazapine, and levothyrixine. Phone:[phone] Fax: [phone]

## 2011-08-02 MED ORDER — SIMVASTATIN 20 MG PO TABS: 20.0000 mg | ORAL_TABLET | Freq: Every day | ORAL | Status: DC

## 2011-08-02 MED ORDER — POTASSIUM CHLORIDE CRYS ER 10 MEQ PO TBCR: 10.0000 meq | EXTENDED_RELEASE_TABLET | Freq: Every day | ORAL | Status: DC

## 2011-08-02 MED ORDER — LEVOTHYROXINE SODIUM 50 MCG PO TABS: 50.0000 ug | ORAL_TABLET | Freq: Every day | ORAL | Status: DC

## 2011-08-02 MED ORDER — MIRTAZAPINE 15 MG PO TABS: 15.0000 mg | ORAL_TABLET | Freq: Every day | ORAL | Status: DC

## 2011-08-02 MED ORDER — FUROSEMIDE 20 MG PO TABS: 20.0000 mg | ORAL_TABLET | Freq: Every day | ORAL | Status: DC

## 2011-08-02 MED ORDER — VENLAFAXINE HCL 37.5 MG PO TABS: 37.5000 mg | ORAL_TABLET | Freq: Two times a day (BID) | ORAL | Status: DC

## 2011-08-02 MED ORDER — DONEPEZIL HCL 10 MG PO TABS: 10.0000 mg | ORAL_TABLET | Freq: Every day | ORAL | Status: DC

## 2011-08-02 NOTE — Telephone Encounter (Signed)
[facility], she states that pt also needs refill on Furosemide 20mg . Pt still had refill on file for Donepezil, Namenda and Klor Con so she was advised to cancel those refills. Furosemide refill sent to pharmacy for #30 x no refills.

## 2011-08-26 ENCOUNTER — Other Ambulatory Visit: Payer: Self-pay | Admitting: Internal Medicine

## 2011-08-28 ENCOUNTER — Telehealth: Payer: Self-pay | Admitting: Internal Medicine

## 2011-08-28 DIAGNOSIS — E785 Hyperlipidemia, unspecified: Secondary | ICD-10-CM

## 2011-08-28 MED ORDER — SIMVASTATIN 20 MG PO TABS: 20.0000 mg | ORAL_TABLET | Freq: Every day | ORAL | Status: DC

## 2011-08-28 MED ORDER — FUROSEMIDE 20 MG PO TABS: 20.0000 mg | ORAL_TABLET | Freq: Every day | ORAL | Status: DC

## 2011-08-28 MED ORDER — LEVOTHYROXINE SODIUM 50 MCG PO TABS: 50.0000 ug | ORAL_TABLET | Freq: Every day | ORAL | Status: DC

## 2011-08-28 NOTE — Telephone Encounter (Signed)
Rx refill sent to pharmacy. Office visit scheduled for 08/31/2011 @ 4pm with Dr Charlynn Court

## 2011-08-28 NOTE — Telephone Encounter (Signed)
Refill- levothyroxine tablet. Take one tablet by mouth daily. Qty 30. Last fill 6.28.12  Refill- furosemide 20mg  tablet. Take one tablet by mouth every day. Qty 30. Last fill 6.28.12  Refill- simvastatin 20mg  tablet. Take one tablet by mouth every day. Qty 30. Last fill 6.2.12

## 2011-08-29 ENCOUNTER — Encounter (INDEPENDENT_AMBULATORY_CARE_PROVIDER_SITE_OTHER): Payer: Medicare Other | Admitting: Ophthalmology

## 2011-08-29 DIAGNOSIS — H35039 Hypertensive retinopathy, unspecified eye: Secondary | ICD-10-CM

## 2011-08-29 DIAGNOSIS — H2510 Age-related nuclear cataract, unspecified eye: Secondary | ICD-10-CM

## 2011-08-29 DIAGNOSIS — H43819 Vitreous degeneration, unspecified eye: Secondary | ICD-10-CM

## 2011-08-29 DIAGNOSIS — H348192 Central retinal vein occlusion, unspecified eye, stable: Secondary | ICD-10-CM

## 2011-08-31 ENCOUNTER — Ambulatory Visit (INDEPENDENT_AMBULATORY_CARE_PROVIDER_SITE_OTHER): Payer: Medicare Other | Admitting: Internal Medicine

## 2011-08-31 ENCOUNTER — Encounter: Payer: Self-pay | Admitting: Internal Medicine

## 2011-08-31 VITALS — BP 90/60 | HR 61 | Temp 98.3°F | Resp 18 | Wt 181.0 lb

## 2011-08-31 DIAGNOSIS — F0280 Dementia in other diseases classified elsewhere without behavioral disturbance: Secondary | ICD-10-CM

## 2011-08-31 DIAGNOSIS — E039 Hypothyroidism, unspecified: Secondary | ICD-10-CM

## 2011-08-31 DIAGNOSIS — G309 Alzheimer's disease, unspecified: Secondary | ICD-10-CM

## 2011-08-31 LAB — TSH: TSH: 2.419 u[IU]/mL (ref 0.350–4.500)

## 2011-08-31 LAB — T4, FREE: Free T4: 1.22 ng/dL (ref 0.80–1.80)

## 2011-08-31 MED ORDER — MEMANTINE HCL 10 MG PO TABS: 10.0000 mg | ORAL_TABLET | Freq: Every day | ORAL | Status: DC

## 2011-08-31 MED ORDER — POTASSIUM CHLORIDE 10 MEQ PO TBCR: 10.0000 meq | EXTENDED_RELEASE_TABLET | Freq: Every day | ORAL | Status: DC

## 2011-08-31 MED ORDER — LEVOTHYROXINE SODIUM 50 MCG PO TABS: 50.0000 ug | ORAL_TABLET | Freq: Every day | ORAL | Status: DC

## 2011-08-31 MED ORDER — DONEPEZIL HCL 10 MG PO TABS: 10.0000 mg | ORAL_TABLET | Freq: Every day | ORAL | Status: DC

## 2011-08-31 MED ORDER — FUROSEMIDE 20 MG PO TABS: 20.0000 mg | ORAL_TABLET | Freq: Every day | ORAL | Status: DC

## 2011-08-31 MED ORDER — MIRTAZAPINE 15 MG PO TABS: 15.0000 mg | ORAL_TABLET | Freq: Every day | ORAL | Status: DC

## 2011-09-01 NOTE — Progress Notes (Signed)
  Subjective:    Patient ID: Carrie Stephens, female    DOB: 1920-10-05, 77 y.o.   MRN: [id number]  HPI Pt presents to clinic for followup of multiple medical problems. Accompanied by her daughter who assists with hx with her permission. Tolerating aricept/namenda without side effect. Family feels the medication has been helpful. Performs her adl's. Good appetite. No recent safety concerns. Typically has chronic loose stools requiring imodium prn but recently stools have been nl without medication. H/o hypothyroidism on stable dose of synthroid. Tolerating statin tx without myalgias or abn lfts. No active complaints.  Past Medical History  Diagnosis Date  . S/P lumpectomy of breast 01/2006    and sentinel node dissection   . Radiation 04/2006    s/p external radiation -Arimidex since 04/12/2006  . Alzheimer disease     moderate to severe  . Hyperlipidemia   . Depression   . History of UTI   . Vision loss, left eye     secondary to retinal hemorrhage, unclear etiology  . Osteoporosis     s/p zometa  3.3 mg IV 05/2007  . Thyroid nodule   . 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 Vaginal hysterectomy 1960's    status post  . 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 She has never used smokeless tobacco. Her alcohol and drug histories not on file. family history includes Breast cancer in her mother and Diabetes in her father.  There is no history of Other. Allergies  Allergen Reactions  . Codeine Phosphate     REACTION: unspecified  . Ibuprofen     REACTION: unspecified  . Moxifloxacin     REACTION: rash     Review of Systems     Objective:   Physical Exam  Physical Exam  Nursing note and vitals  reviewed. Constitutional: Appears well-developed and well-nourished. No distress.  HENT: PERRL Head: Normocephalic and atraumatic.  Right Ear: External ear normal.  Left Ear: External ear normal.  Eyes: Conjunctivae are normal. No scleral icterus.  Neck: Neck supple. Carotid bruit is not present.  Cardiovascular: Normal rate, regular rhythm and normal heart sounds.  Exam reveals no gallop and no friction rub.   No murmur heard. Pulmonary/Chest: Effort normal and breath sounds normal. No respiratory distress. He has no wheezes. no rales.  Lymphadenopathy:    He has no cervical adenopathy.  Neurological:Alert.  Skin: Skin is warm and dry. Not diaphoretic.  Psychiatric: Has a normal mood and affect.        Assessment & Plan:

## 2011-09-01 NOTE — Assessment & Plan Note (Signed)
Obtain tsh/free t4 

## 2011-09-01 NOTE — Assessment & Plan Note (Signed)
Stable. Tolerating aricept/namenda. Continue medication.

## 2011-09-28 ENCOUNTER — Telehealth: Payer: Self-pay | Admitting: Internal Medicine

## 2011-09-28 DIAGNOSIS — E785 Hyperlipidemia, unspecified: Secondary | ICD-10-CM

## 2011-09-28 MED ORDER — SIMVASTATIN 20 MG PO TABS: 20.0000 mg | ORAL_TABLET | Freq: Every day | ORAL | Status: DC

## 2011-09-28 NOTE — Telephone Encounter (Signed)
Rx refill sent to pharmacy. 

## 2011-09-28 NOTE — Telephone Encounter (Signed)
Refill- simvastatin 20mg  tablet. Take one tablet by mouth at bedtime. Qty 30. Last fill 9.4.12

## 2011-10-24 ENCOUNTER — Telehealth: Payer: Self-pay | Admitting: *Deleted

## 2011-10-24 MED ORDER — MEMANTINE HCL 10 MG PO TABS: 10.0000 mg | ORAL_TABLET | Freq: Two times a day (BID) | ORAL | Status: DC

## 2011-10-24 NOTE — Telephone Encounter (Signed)
Patients daughter in law called and left voice message stating patient was taking Namenda 10 mg twice a day and the Rx that was filled in September was for once a day. She is requesting a corrected Rx to the pharmacy on file.  Call was returned to Gavin Pound at [phone], she has verified that patient was previously taking Namenda 10 twice a day and the Rx she picked up from the pharmacy directions were once a day. The previous Rx written in July with refills reflect Namenda 10 mg  By mouth twice a day.  Gavin Pound was informed corrected Rx would be sent to pharmacy. [facility]. She was provided a Acupuncturist on the [place] Rx. She stated she will edit the Rx that was picked up yesterday to reflect changes.

## 2011-11-13 ENCOUNTER — Encounter: Payer: Self-pay | Admitting: Internal Medicine

## 2011-11-13 ENCOUNTER — Ambulatory Visit (INDEPENDENT_AMBULATORY_CARE_PROVIDER_SITE_OTHER): Payer: Medicare Other | Admitting: Internal Medicine

## 2011-11-13 VITALS — BP 100/60 | HR 66 | Temp 98.2°F | Resp 18 | Wt 183.0 lb

## 2011-11-13 DIAGNOSIS — H6120 Impacted cerumen, unspecified ear: Secondary | ICD-10-CM

## 2011-11-16 DIAGNOSIS — H6120 Impacted cerumen, unspecified ear: Secondary | ICD-10-CM | POA: Insufficient documentation

## 2011-11-16 NOTE — Assessment & Plan Note (Signed)
Ears examined pre and post irrigation. Right canal cleared. Left canal with mild residual. Attempt debrox at home as well. Followup if no improvement or worsening.

## 2011-11-16 NOTE — Progress Notes (Signed)
  Subjective:    Patient ID: Carrie Stephens, female    DOB: August 07, 1920, 77 y.o.   MRN: [id number]  HPI Pt presents to clinic for evaluation of decreased hearing. Has occurred over ~2wks. No acute loss of hearing. No injury/trauma. Denies ear pain or drainage. No alleviating or exacerbating factors. No other complaints.  Past Medical History  Diagnosis Date  . S/P lumpectomy of breast 01/2006    and sentinel node dissection   . Radiation 04/2006    s/p external radiation -Arimidex since 04/12/2006  . Alzheimer disease     moderate to severe  . Hyperlipidemia   . Depression   . History of UTI   . Vision loss, left eye     secondary to retinal hemorrhage, unclear etiology  . Osteoporosis     s/p zometa  3.3 mg IV 05/2007  . Thyroid nodule   . 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 Vaginal hysterectomy 1960's    status post  . 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 She has never used smokeless tobacco. Her alcohol and drug histories not on file. family history includes Breast cancer in her mother and Diabetes in her father.  There is no history of Other. Allergies  Allergen Reactions  . Codeine Phosphate     REACTION: unspecified  . Ibuprofen     REACTION: unspecified  . Moxifloxacin     REACTION: rash     Review of Systems see hpi     Objective:   Physical Exam  Nursing note and vitals reviewed. Constitutional: She appears well-developed and well-nourished. No distress.  HENT:  Head: Normocephalic and atraumatic.  Right Ear: External ear normal. Decreased hearing is noted.  Left Ear: External ear normal. Decreased hearing is noted.       Bilateral cerumen impaction.  Eyes: Conjunctivae  are normal. No scleral icterus.  Neurological: She is alert.  Skin: She is not diaphoretic.  Psychiatric: She has a normal mood and affect.          Assessment & Plan:

## 2011-12-03 ENCOUNTER — Ambulatory Visit (HOSPITAL_BASED_OUTPATIENT_CLINIC_OR_DEPARTMENT_OTHER)
Admission: RE | Admit: 2011-12-03 | Discharge: 2011-12-03 | Disposition: A | Discharge status: Home / Self Care | Payer: Medicare Other | Source: Ambulatory Visit | Attending: Internal Medicine | Admitting: Internal Medicine

## 2011-12-03 ENCOUNTER — Encounter: Payer: Self-pay | Admitting: Internal Medicine

## 2011-12-03 ENCOUNTER — Ambulatory Visit (INDEPENDENT_AMBULATORY_CARE_PROVIDER_SITE_OTHER): Payer: Medicare Other | Admitting: Internal Medicine

## 2011-12-03 VITALS — BP 100/60 | HR 70 | Temp 98.5°F | Resp 20 | Wt 180.0 lb

## 2011-12-03 DIAGNOSIS — R509 Fever, unspecified: Secondary | ICD-10-CM

## 2011-12-03 DIAGNOSIS — R05 Cough: Secondary | ICD-10-CM | POA: Insufficient documentation

## 2011-12-03 DIAGNOSIS — R059 Cough, unspecified: Secondary | ICD-10-CM | POA: Insufficient documentation

## 2011-12-03 DIAGNOSIS — R0989 Other specified symptoms and signs involving the circulatory and respiratory systems: Secondary | ICD-10-CM | POA: Insufficient documentation

## 2011-12-03 MED ORDER — DOXYCYCLINE HYCLATE 100 MG PO TABS: 100.0000 mg | ORAL_TABLET | Freq: Two times a day (BID) | ORAL | Status: DC

## 2011-12-03 NOTE — Assessment & Plan Note (Signed)
Obtain CXR due to lung xray. ?UAW noise vs sl rale. Printed abx given. Begin if sx's do not improve after total duration of 7 days. Followup if no improvement or worsening.

## 2011-12-03 NOTE — Progress Notes (Signed)
  Subjective:    Patient ID: Carrie Stephens, female    DOB: 1920-09-25, 77 y.o.   MRN: [id number]  HPI Pt presents to clinic for evaluation of cough. Notes 4 day h/o NP cough and chest congestion. +clear nasal drainage. No f/c, dyspnea or wheezing. Taking otc robitussin with some improvement. Appears comfortable. No other alleviating or exacerbating factors. No other complaints.  Past Medical History  Diagnosis Date  . S/P lumpectomy of breast 01/2006    and sentinel node dissection   . Radiation 04/2006    s/p external radiation -Arimidex since 04/12/2006  . Alzheimer disease     moderate to severe  . Hyperlipidemia   . Depression   . History of UTI   . Vision loss, left eye     secondary to retinal hemorrhage, unclear etiology  . Osteoporosis     s/p zometa  3.3 mg IV 05/2007  . Thyroid nodule   . 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 Vaginal hysterectomy 1960's    status post  . 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 She has never used smokeless tobacco. Her alcohol and drug histories not on file. family history includes Breast cancer in her mother and Diabetes in her father.  There is no history of Other. Allergies  Allergen Reactions  . Codeine Phosphate     REACTION: unspecified  . Ibuprofen     REACTION: unspecified  . Moxifloxacin     REACTION: rash     Review of Systems see hpi    Objective:   Physical Exam  Nursing note and vitals reviewed. Constitutional: She appears well-developed and well-nourished. No distress.  HENT:  Head: Normocephalic and atraumatic.  Right Ear: External ear normal.  Left Ear: External ear normal.  Nose: Nose normal.  Mouth/Throat: Oropharynx is  clear and moist. No oropharyngeal exudate.  Eyes: Conjunctivae are normal. Right eye exhibits no discharge. Left eye exhibits no discharge. No scleral icterus.  Neck: Neck supple. No JVD present.  Cardiovascular: Normal rate, regular rhythm and normal heart sounds.   Pulmonary/Chest: Effort normal. No respiratory distress. She has no wheezes.       Questionable faint area of congestion/?sl rale right mid lung field. ?resolved with nasal breathing.  Lymphadenopathy:    She has no cervical adenopathy.  Neurological: She is alert.  Skin: Skin is warm and dry. She is not diaphoretic.  Psychiatric: She has a normal mood and affect.          Assessment & Plan:

## 2011-12-04 ENCOUNTER — Telehealth: Payer: Self-pay | Admitting: *Deleted

## 2011-12-04 NOTE — Telephone Encounter (Signed)
Rx change from 7 day supply to 10 day supply. Follow up appointment has been scheduled for 12/13/2011.

## 2011-12-13 ENCOUNTER — Ambulatory Visit (INDEPENDENT_AMBULATORY_CARE_PROVIDER_SITE_OTHER): Payer: Medicare Other | Admitting: Internal Medicine

## 2011-12-13 ENCOUNTER — Encounter: Payer: Self-pay | Admitting: Internal Medicine

## 2011-12-13 VITALS — BP 100/60 | HR 60 | Temp 97.9°F | Resp 18

## 2011-12-13 DIAGNOSIS — J189 Pneumonia, unspecified organism: Secondary | ICD-10-CM

## 2011-12-13 MED ORDER — DOXYCYCLINE HYCLATE 100 MG PO TABS: 100.0000 mg | ORAL_TABLET | Freq: Two times a day (BID) | ORAL | Status: AC

## 2011-12-16 DIAGNOSIS — J189 Pneumonia, unspecified organism: Secondary | ICD-10-CM | POA: Insufficient documentation

## 2011-12-16 NOTE — Progress Notes (Signed)
  Subjective:    Patient ID: Carrie Stephens, female    DOB: 02-12-1920, 77 y.o.   MRN: [id number]  HPI Pt presents to clinic for follow up of pneumonia. Reviewed cxr suggestive of rml pneumonia. Tolerating doxycycline without adverse effect. Pt's daughter notes 60% better already. Has slight cough without f/c, dyspnea or wheezing. No alleviating or exacerbating factors. No other complaints.  Past Medical History  Diagnosis Date  . S/P lumpectomy of breast 01/2006    and sentinel node dissection   . Radiation 04/2006    s/p external radiation -Arimidex since 04/12/2006  . Alzheimer disease     moderate to severe  . Hyperlipidemia   . Depression   . History of UTI   . Vision loss, left eye     secondary to retinal hemorrhage, unclear etiology  . Osteoporosis     s/p zometa  3.3 mg IV 05/2007  . Thyroid nodule   . 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 Vaginal hysterectomy 1960's    status post  . 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 She has never used smokeless tobacco. Her alcohol and drug histories not on file. family history includes Breast cancer in her mother and Diabetes in her father.  There is no history of Other. Allergies  Allergen Reactions  . Codeine Phosphate     REACTION: unspecified  . Ibuprofen     REACTION: unspecified  . Moxifloxacin     REACTION: rash     Review of Systems see hpi     Objective:   Physical Exam  Nursing note and vitals reviewed. Constitutional: She appears well-developed and well-nourished. No distress.  HENT:  Head: Normocephalic and atraumatic.  Right Ear: External ear normal.  Left Ear: External ear normal.  Eyes: Conjunctivae are normal. No  scleral icterus.  Neck: Neck supple.  Cardiovascular: Normal rate, regular rhythm and normal heart sounds.   Pulmonary/Chest: Effort normal and breath sounds normal.  Neurological: She is alert.  Skin: Skin is warm and dry. She is not diaphoretic.  Psychiatric: She has a normal mood and affect.          Assessment & Plan:

## 2011-12-16 NOTE — Assessment & Plan Note (Signed)
Clinically stable and feels improved. Extend abx course. Schedule f/u cxr. F/u closely if no improvement or worsening

## 2011-12-26 ENCOUNTER — Encounter (INDEPENDENT_AMBULATORY_CARE_PROVIDER_SITE_OTHER): Payer: Medicare Other | Admitting: Ophthalmology

## 2012-01-29 ENCOUNTER — Telehealth: Payer: Self-pay | Admitting: Oncology

## 2012-01-29 NOTE — Telephone Encounter (Signed)
Lvm advising lab/mc/zometa appt 04/15/12 @ 1.30pm. I have also mailed pt an appt calendar.

## 2012-02-21 ENCOUNTER — Encounter (INDEPENDENT_AMBULATORY_CARE_PROVIDER_SITE_OTHER): Payer: Medicare Other | Admitting: Ophthalmology

## 2012-02-21 DIAGNOSIS — H35039 Hypertensive retinopathy, unspecified eye: Secondary | ICD-10-CM

## 2012-02-21 DIAGNOSIS — H2510 Age-related nuclear cataract, unspecified eye: Secondary | ICD-10-CM

## 2012-02-21 DIAGNOSIS — I10 Essential (primary) hypertension: Secondary | ICD-10-CM

## 2012-02-21 DIAGNOSIS — H348392 Tributary (branch) retinal vein occlusion, unspecified eye, stable: Secondary | ICD-10-CM

## 2012-02-21 DIAGNOSIS — H43819 Vitreous degeneration, unspecified eye: Secondary | ICD-10-CM

## 2012-02-21 DIAGNOSIS — H348192 Central retinal vein occlusion, unspecified eye, stable: Secondary | ICD-10-CM

## 2012-03-29 ENCOUNTER — Other Ambulatory Visit: Payer: Self-pay | Admitting: Internal Medicine

## 2012-03-31 NOTE — Telephone Encounter (Signed)
Rx refills sent to pharmacy. 

## 2012-04-10 ENCOUNTER — Other Ambulatory Visit: Payer: Self-pay | Admitting: Medical Oncology

## 2012-04-10 ENCOUNTER — Other Ambulatory Visit: Payer: Self-pay | Admitting: Oncology

## 2012-04-11 ENCOUNTER — Other Ambulatory Visit: Payer: Self-pay | Admitting: Lab

## 2012-04-11 DIAGNOSIS — C50919 Malignant neoplasm of unspecified site of unspecified female breast: Secondary | ICD-10-CM

## 2012-04-12 ENCOUNTER — Other Ambulatory Visit: Payer: Self-pay | Admitting: Internal Medicine

## 2012-04-14 NOTE — Telephone Encounter (Signed)
Rx refill sent to pharmacy. 

## 2012-04-15 ENCOUNTER — Ambulatory Visit (HOSPITAL_BASED_OUTPATIENT_CLINIC_OR_DEPARTMENT_OTHER): Payer: Medicare Other

## 2012-04-15 ENCOUNTER — Encounter: Payer: Self-pay | Admitting: Oncology

## 2012-04-15 ENCOUNTER — Ambulatory Visit (HOSPITAL_BASED_OUTPATIENT_CLINIC_OR_DEPARTMENT_OTHER): Payer: Medicare Other | Admitting: Oncology

## 2012-04-15 ENCOUNTER — Other Ambulatory Visit (HOSPITAL_BASED_OUTPATIENT_CLINIC_OR_DEPARTMENT_OTHER): Payer: Medicare Other

## 2012-04-15 ENCOUNTER — Ambulatory Visit (HOSPITAL_COMMUNITY)
Admission: RE | Admit: 2012-04-15 | Discharge: 2012-04-15 | Disposition: A | Discharge status: Home / Self Care | Payer: Medicare Other | Source: Ambulatory Visit | Attending: Oncology | Admitting: Oncology

## 2012-04-15 VITALS — BP 100/51 | HR 62 | Temp 97.5°F | Ht 68.0 in | Wt 173.9 lb

## 2012-04-15 DIAGNOSIS — M810 Age-related osteoporosis without current pathological fracture: Secondary | ICD-10-CM

## 2012-04-15 DIAGNOSIS — Z853 Personal history of malignant neoplasm of breast: Secondary | ICD-10-CM

## 2012-04-15 DIAGNOSIS — E538 Deficiency of other specified B group vitamins: Secondary | ICD-10-CM

## 2012-04-15 DIAGNOSIS — C50919 Malignant neoplasm of unspecified site of unspecified female breast: Secondary | ICD-10-CM | POA: Insufficient documentation

## 2012-04-15 DIAGNOSIS — I517 Cardiomegaly: Secondary | ICD-10-CM | POA: Insufficient documentation

## 2012-04-15 LAB — CBC WITH DIFFERENTIAL/PLATELET
BASO%: 1.4 % (ref 0.0–2.0)
EOS%: 2.5 % (ref 0.0–7.0)
LYMPH%: 40.4 % (ref 14.0–49.7)
MCH: 30.9 pg (ref 25.1–34.0)
MCHC: 34.2 g/dL (ref 31.5–36.0)
MONO#: 0.4 10*3/uL (ref 0.1–0.9)
MONO%: 7.8 % (ref 0.0–14.0)
Platelets: 223 10*3/uL (ref 145–400)
RBC: 4.4 10*6/uL (ref 3.70–5.45)
WBC: 5.1 10*3/uL (ref 3.9–10.3)
nRBC: 0 % (ref 0–0)

## 2012-04-15 MED ORDER — ZOLEDRONIC ACID 4 MG/100ML IV SOLN
4.0000 mg | Freq: Once | INTRAVENOUS | Status: AC
  Administered 2012-04-15: 4 mg via INTRAVENOUS
  Filled 2012-04-15: qty 100

## 2012-04-15 MED ORDER — SODIUM CHLORIDE 0.9 % IV SOLN
Freq: Once | INTRAVENOUS | Status: AC
  Administered 2012-04-15: 16:00:00 via INTRAVENOUS

## 2012-04-15 NOTE — Patient Instructions (Signed)
[facility] Discharge Instructions for Patients Receiving Chemotherapy  Today you received the following chemotherapy agents Zometa  To help prevent nausea and vomiting after your treatment, we encourage you to take your nausea medication as prescribed by MD    If you develop nausea and vomiting that is not controlled by your nausea medication, call the clinic. If it is after clinic hours your family physician or the after hours number for the clinic or go to the Emergency Department.   BELOW ARE SYMPTOMS THAT SHOULD BE REPORTED IMMEDIATELY:  *FEVER GREATER THAN 100.5 F  *CHILLS WITH OR WITHOUT FEVER  NAUSEA AND VOMITING THAT IS NOT CONTROLLED WITH YOUR NAUSEA MEDICATION  *UNUSUAL SHORTNESS OF BREATH  *UNUSUAL BRUISING OR BLEEDING  TENDERNESS IN MOUTH AND THROAT WITH OR WITHOUT PRESENCE OF ULCERS  *URINARY PROBLEMS  *BOWEL PROBLEMS  UNUSUAL RASH Items with * indicate a potential emergency and should be followed up as soon as possible.  One of the nurses will contact you 24 hours after your treatment. Please let the nurse know about any problems that you may have experienced. Feel free to call the clinic you have any questions or concerns. The clinic phone number is [phone].   I have been informed and understand all the instructions given to me. I know to contact the clinic, my physician, or go to the Emergency Department if any problems should occur. I do not have any questions at this time, but understand that I may call the clinic during office hours   should I have any questions or need assistance in obtaining follow up care.    __________________________________________  _____________  __________ Signature of Patient or Authorized Representative            Date                   Time    __________________________________________ Nurse's Signature

## 2012-04-15 NOTE — Progress Notes (Signed)
Quick Note:  Please notify patient and call/fax these results to patient's doctors. ______ 

## 2012-04-15 NOTE — Progress Notes (Signed)
CC:   Charlynn Court, MD  PROBLEM LIST: 1. Invasive cancer involving the left breast, stage IIA (T2 N0),     diagnosed when the patient was living in a [place].  Primary tumor     measured 3.0 cm.  Lymphovascular and perineural invasion were     present.  Estrogen receptor was 93%, progesterone receptor was     weakly positive, HER2/neu was negative.  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  The patient underwent lumpectomy and sentinel     lymph node biopsy on January 25, 2006.  She completed radiation     treatments to the left breast from March 21, 2006, through May 08, 2006.  The patient was on Arimidex 1 mg daily since April 12, 2006,     and completed this in June 2012.  She is on no therapy at this time     and without evidence of recurrent breast cancer. 2. Alzheimer disease with onset dating back at least 10 years. 3. Osteoporosis detected by a bone density scan in May 2007. 4. History of low vitamin B12 level. 5. Dyslipidemia. 6. Goiter detected in October 2009.  The patient is status post right     thyroid lobectomy and isthmectomy on 09/20/2009. 7. History of vaginal hysterectomy in 1964.  MEDICATIONS: 1. Aspirin 81 mg daily. 2. Os-Cal 600 mg twice daily. 3. Cholecalciferol 1000 units daily. 4. Aricept 10 mg at bedtime. 5. Lasix 20 mg daily. 6. Klor-Con 10 mg 10 mEq daily. 7. Levothyroxine 50 mcg daily. 8. Namenda 10 mg twice daily. 9. Remeron 15 mg at bedtime. 10.Zocor 20 mg at bedtime.  HISTORY:  Carrie Stephens was seen today for followup of her stage IIA invasive carcinoma involving the left breast dating back to February 2007.  The patient underwent lumpectomy and sentinel node biopsy while she was in [place] and then came to [place] where she received radiation treatments to the left breast and was on Arimidex for 5 years. She is on no therapy at this time.  She is accompanied by her daughter, Virl Axe, with whom she lives.  The patient  was last seen by [place] on 06/12/2011.  She gets yearly Zometa 4 mg IV.  She is due for that today.  The patient's general status has not appeared to have changed any.  It looks like she had a chest x-ray on December 03, 2011, that showed a right middle lobe infiltrate consistent with pneumonia.  The patient did receive antibiotics.  I do not think she has had a followup chest x-ray.  Her last screening mammograms were carried out on 05/25/2010.  The decision, I believe, was made at the last appointment on 06/12/2011 not to continue with screening mammograms.  The patient turned 89 on February 9th.  There is no obvious evidence of recurrent breast cancer.  She is without complaints and her status remains quite stable.  PHYSICAL EXAM:  Vital Signs:  There is little change.  Weight is 107 D. 3.9 pounds.  The patient's fluctuates but her weight has been fairly stable.  Height 5 feet 8 inches, body surface area 1.95 sq m.  Blood pressure 100/51.  Other vital signs are normal.  HEENT:  There is no scleral icterus.  Mouth and pharynx are benign.  Neck:  Without adenopathy or thyroid enlargement.  The patient has a well-healed scar. Heart/lungs:  Normal.  Breasts:  Right breast is benign.  Left breast shows  changes consistent with surgery, particularly at the 6 o'clock position where there is some dimpling and distortion, but the breast remains soft without any suspicious findings.  Radiation changes are fairly subtle.  No axillary adenopathy.  Abdomen:  Obese, nontender, with no organomegaly masses palpable.  Extremities:  No peripheral edema or clubbing.  No obvious lymphedema of the left arm.  Neurologic Exam: Nonfocal.  The patient is pleasant.  I did not do any mental status testing.  She does have what appear to be a lot of seborrheic keratoses.  LABORATORY DATA:  Today, white count 5.1, ANC 2.5, hemoglobin 13.6, hematocrit 39.8, platelets 223,000.  Chemistries, vitamin D level, and vitamin  B12 level are all pending.  On 06/12/2011, chemistries were normal except for glucose of 115.  In looking at the patient's chart, her last vitamin B12 level was from 11/30/2009 and it was 642.  Back on 05/01/2007, the vitamin B12 level was 146.  IMAGING STUDIES: 1. Bone density scan from 05/25/2010 showed a T-score for the left     forearm of -1.2.  T-score for the left femoral neck was -2.0. 2. Digital diagnostic bilateral mammogram on 05/25/2010 was negative. 3. Chest x-ray, 2 view from 12/03/2011, showed right middle lobe     infiltrate compatible with pneumonia.  Chest x-ray from 03/22/2010     and was negative, did not show airspace disease.  In addition,     there was a small right pleural effusion noted on the chest x-ray     of 12/03/2011.  Chest x-ray will be repeated today.  IMPRESSION AND PLAN:  Ms. Settlemyre is now doing well over 6 years from the time of diagnosis in February 2007.  There is no evidence for recurrent breast cancer.  She is not on anything at the present time.  We will follow up on the chest x-ray from December 03, 2011.  The patient will have IV Zometa today 4 mg IV.  She receives this is on a yearly basis. Decision was made to not do screening mammograms at this point.  We will plan to see Mrs. Pfiffner again in 1 year at which time we will check CBC, chemistries, vitamin B12 level.  She will also be scheduled for another dose of IV Zometa at 4 mg IV.  I should mention that her BUN and creatinine on 06/12/2011 were 10 and 1.10 respectively.    ______________________________ Samul Dada, M.D. DSM/MEDQ  D:  04/15/2012  T:  04/15/2012  Job:  [id number]

## 2012-04-15 NOTE — Progress Notes (Signed)
This office note has been dictated.  #[id number]

## 2012-04-18 ENCOUNTER — Telehealth: Payer: Self-pay | Admitting: *Deleted

## 2012-04-18 ENCOUNTER — Telehealth: Payer: Self-pay | Admitting: Oncology

## 2012-04-18 NOTE — Telephone Encounter (Signed)
Per staff message from [place], I have placed the patient on a waiting list for next April.  JMW

## 2012-04-18 NOTE — Telephone Encounter (Signed)
s/w daughter and she is aware of the 2014 appt,mw to add zometa   aom

## 2012-04-25 ENCOUNTER — Telehealth: Payer: Self-pay | Admitting: Internal Medicine

## 2012-04-25 DIAGNOSIS — F0280 Dementia in other diseases classified elsewhere without behavioral disturbance: Secondary | ICD-10-CM

## 2012-04-25 MED ORDER — DONEPEZIL HCL 10 MG PO TABS: 10.0000 mg | ORAL_TABLET | Freq: Every day | ORAL | Status: DC

## 2012-04-25 NOTE — Telephone Encounter (Signed)
Refill- donepezil hcl 10mg  tablet. Take one tablet (10mg  total) by mouth at bedtime. Qty 30 last fill 3.31.13

## 2012-04-25 NOTE — Telephone Encounter (Signed)
Rx refill sent to pharmacy. 

## 2012-06-30 ENCOUNTER — Other Ambulatory Visit: Payer: Self-pay | Admitting: Internal Medicine

## 2012-06-30 NOTE — Telephone Encounter (Signed)
30 day supply of Namenda sent to pharmacy. Pt was due for f/u in March and is past due. Please call pt and arrange f/u.

## 2012-07-01 NOTE — Telephone Encounter (Signed)
Informed patients daughter Carrie Stephens that a 30 day supply has been sent to pharmacy. Carrie Stephens stated that patient is in [place] until beginning of September. Carrie Stephens did make a follow up for patient in September.

## 2012-07-21 ENCOUNTER — Other Ambulatory Visit: Payer: Self-pay | Admitting: Internal Medicine

## 2012-07-30 ENCOUNTER — Other Ambulatory Visit: Payer: Self-pay | Admitting: Internal Medicine

## 2012-07-31 ENCOUNTER — Telehealth: Payer: Self-pay | Admitting: Internal Medicine

## 2012-07-31 NOTE — Telephone Encounter (Signed)
Patient called stating that she is still down in [place], [place] visiting her son. Patient is running low on medications- namenda, simvastatin, and levothyroxine. Please send refill to [facility] # [id number])

## 2012-08-01 NOTE — Telephone Encounter (Signed)
**  NO FUTURE REFILLS WILL BE AUTHORIZED W/O PRIOR OFFICE VISIT** Pt has been notified for several months on her refills that an office visit is overdue [OV scheduled 09.12.13]; 30-day given as patient is in [place] visiting son at this time & needs meds/SLS  

## 2012-08-01 NOTE — Telephone Encounter (Signed)
**  NO FUTURE REFILLS WILL BE AUTHORIZED W/O PRIOR OFFICE VISIT** Pt has been notified for several months on her refills that an office visit is overdue [OV scheduled 09.12.13]; 30-day given as patient is in [place] visiting son at this time & needs meds/SLS

## 2012-08-21 ENCOUNTER — Other Ambulatory Visit: Payer: Self-pay | Admitting: Internal Medicine

## 2012-08-22 NOTE — Telephone Encounter (Signed)
OK to send 15 tabs of remeron.

## 2012-08-22 NOTE — Telephone Encounter (Signed)
Rx done/SLS 

## 2012-08-22 NOTE — Telephone Encounter (Signed)
Pt has been notified for several months on her refills that an office visit is overdue [OV scheduled 09.12.13]; 15-day supply on Furosemide given as patient is in [place] visiting son at this time & needs meds/SLS  Please advise on request for Remeron [ok to give 15-day supply, as well/SLS

## 2012-09-04 ENCOUNTER — Ambulatory Visit (INDEPENDENT_AMBULATORY_CARE_PROVIDER_SITE_OTHER): Payer: Medicare Other | Admitting: Internal Medicine

## 2012-09-04 ENCOUNTER — Encounter: Payer: Self-pay | Admitting: Internal Medicine

## 2012-09-04 VITALS — BP 94/58 | HR 63 | Temp 98.2°F | Resp 18 | Wt 172.0 lb

## 2012-09-04 DIAGNOSIS — G309 Alzheimer's disease, unspecified: Secondary | ICD-10-CM

## 2012-09-04 DIAGNOSIS — E039 Hypothyroidism, unspecified: Secondary | ICD-10-CM

## 2012-09-04 DIAGNOSIS — F0280 Dementia in other diseases classified elsewhere without behavioral disturbance: Secondary | ICD-10-CM

## 2012-09-04 DIAGNOSIS — Z79899 Other long term (current) drug therapy: Secondary | ICD-10-CM

## 2012-09-04 DIAGNOSIS — E785 Hyperlipidemia, unspecified: Secondary | ICD-10-CM

## 2012-09-04 DIAGNOSIS — Z23 Encounter for immunization: Secondary | ICD-10-CM

## 2012-09-04 DIAGNOSIS — I959 Hypotension, unspecified: Secondary | ICD-10-CM

## 2012-09-04 LAB — CBC WITH DIFFERENTIAL/PLATELET
Eosinophils Absolute: 0.2 10*3/uL (ref 0.0–0.7)
Hemoglobin: 13.4 g/dL (ref 12.0–15.0)
Lymphocytes Relative: 39 % (ref 12–46)
Lymphs Abs: 2.4 10*3/uL (ref 0.7–4.0)
MCH: 30.6 pg (ref 26.0–34.0)
Monocytes Relative: 10 % (ref 3–12)
Neutro Abs: 2.9 10*3/uL (ref 1.7–7.7)
Neutrophils Relative %: 46 % (ref 43–77)
Platelets: 290 10*3/uL (ref 150–400)
RBC: 4.38 MIL/uL (ref 3.87–5.11)
WBC: 6.1 10*3/uL (ref 4.0–10.5)

## 2012-09-04 LAB — HEPATIC FUNCTION PANEL
Albumin: 4 g/dL (ref 3.5–5.2)
Alkaline Phosphatase: 53 U/L (ref 39–117)
Bilirubin, Direct: 0.1 mg/dL (ref 0.0–0.3)
Indirect Bilirubin: 0.4 mg/dL (ref 0.0–0.9)
Total Bilirubin: 0.5 mg/dL (ref 0.3–1.2)

## 2012-09-04 LAB — BASIC METABOLIC PANEL
CO2: 26 mEq/L (ref 19–32)
Chloride: 108 mEq/L (ref 96–112)
Glucose, Bld: 141 mg/dL — ABNORMAL HIGH (ref 70–99)
Potassium: 4.4 mEq/L (ref 3.5–5.3)
Sodium: 145 mEq/L (ref 135–145)

## 2012-09-04 LAB — LIPID PANEL: LDL Cholesterol: 43 mg/dL (ref 0–99)

## 2012-09-04 MED ORDER — MEMANTINE HCL 10 MG PO TABS: ORAL_TABLET | ORAL | Status: DC

## 2012-09-04 MED ORDER — FUROSEMIDE 20 MG PO TABS: ORAL_TABLET | ORAL | Status: DC

## 2012-09-04 MED ORDER — LEVOTHYROXINE SODIUM 50 MCG PO TABS: 50.0000 ug | ORAL_TABLET | Freq: Every day | ORAL | Status: DC

## 2012-09-04 MED ORDER — DONEPEZIL HCL 10 MG PO TABS: 10.0000 mg | ORAL_TABLET | Freq: Every day | ORAL | Status: DC

## 2012-09-04 MED ORDER — POTASSIUM CHLORIDE ER 10 MEQ PO TBCR: 10.0000 meq | EXTENDED_RELEASE_TABLET | Freq: Every day | ORAL | Status: DC

## 2012-09-04 MED ORDER — SIMVASTATIN 20 MG PO TABS: 20.0000 mg | ORAL_TABLET | Freq: Every day | ORAL | Status: DC

## 2012-09-04 MED ORDER — MIRTAZAPINE 15 MG PO TABS: 15.0000 mg | ORAL_TABLET | Freq: Every day | ORAL | Status: DC

## 2012-09-04 NOTE — Assessment & Plan Note (Signed)
Asymptomatic. Hold Lasix for four days. Intake one half tablet daily. Call clinic blood pressure readings within one to two weeks.

## 2012-09-04 NOTE — Assessment & Plan Note (Signed)
Obtain TSH and free T4 

## 2012-09-04 NOTE — Patient Instructions (Signed)
Please stop your lasix(forosemide) for 4 days then after that take a half tablet a day. Please call the clinic with a report of your blood pressure in approximately 2 weeks

## 2012-09-04 NOTE — Assessment & Plan Note (Signed)
Obtain fasting lipid profile and liver function test 

## 2012-09-04 NOTE — Progress Notes (Signed)
  Subjective:    Patient ID: Carrie Stephens, female    DOB: [birth year], 78 y.o.   MRN: [id number]  HPI Pt presents to clinic for followup of multiple medical problems. Been doing well without complaints. Weight down but good appetite. Blood pressure low normal without dizziness falls or syncope. Does take Lasix daily. No active complaint  Past Medical History  Diagnosis Date  . S/P lumpectomy of breast 01/2006    and sentinel node dissection   . Radiation 04/2006    s/p external radiation -Arimidex since 04/12/2006  . Alzheimer disease     moderate to severe  . Hyperlipidemia   . Depression   . History of UTI   . Vision loss, left eye     secondary to retinal hemorrhage, unclear etiology  . Osteoporosis     s/p zometa  3.3 mg IV 05/2007  . Thyroid nodule   . 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 Vaginal hysterectomy 1960's    status post  . 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 She has never used smokeless tobacco. Her alcohol and drug histories not on file. family history includes Breast cancer in her mother and Diabetes in her father.  There is no history of Other. Allergies  Allergen Reactions  . Codeine Phosphate     REACTION: unspecified  . Ibuprofen     REACTION: unspecified  . Moxifloxacin     REACTION: rash      Review of Systems see hpi     Objective:   Physical Exam  Physical Exam  Nursing note and vitals reviewed. Constitutional: Appears well-developed and well-nourished. No distress.  HENT:  Head: Normocephalic and atraumatic.  Right Ear: External ear normal.  Left Ear: External ear normal.  Eyes: Conjunctivae are normal. No scleral icterus.  Neck: Neck supple.  Carotid bruit is not present.  Cardiovascular: Normal rate, regular rhythm and normal heart sounds.  Exam reveals no gallop and no friction rub.   No murmur heard. Pulmonary/Chest: Effort normal and breath sounds normal. No respiratory distress. He has no wheezes. no rales.  Lymphadenopathy:    He has no cervical adenopathy.  Neurological:Alert.  Skin: Skin is warm and dry. Not diaphoretic.  Psychiatric: Has a normal mood and affect.        Assessment & Plan:

## 2012-09-15 ENCOUNTER — Telehealth: Payer: Self-pay | Admitting: Internal Medicine

## 2012-09-15 NOTE — Telephone Encounter (Signed)
Can you please clarify the issue of discharge. i don't know about that

## 2012-09-15 NOTE — Telephone Encounter (Signed)
bp 9-17 10am 110/61 9-18 10 am 118/65 9-19 108/59 9-20 123/73  The 1/2 pill of furosemide and now the feet are swelling.  She has a brownish discharge and now it is yellowish.

## 2012-09-15 NOTE — Telephone Encounter (Signed)
LMOM with contact name & number for return call for further information requested by MD/SLS

## 2012-09-16 NOTE — Telephone Encounter (Signed)
1) BP: 9-17 10am 110/61 9-18 10 am 118/65 9-19 108/59 9-20 123/73  2) Taking 1/2 pill of furosemide and now the feet are swelling; would like to know if she should increase to [1] tablet QD or QOD 3) She has a brownish vaginal discharge [now it is yellowish]; no pain and/or hematuria reported, would like to know if pt needs to be seen by PCP and/or GYN. Please advise/SLS

## 2012-09-16 NOTE — Telephone Encounter (Signed)
bp's ok. Can take lasix one tablet QOD. Vaginal discharge usually requires a pelvic exam. We can see her or gyn if she has one.

## 2012-09-17 NOTE — Telephone Encounter (Signed)
LMOM at pt's daughter work phone number with contact name & number, as requested for call back with MD response/recommendations/SLS

## 2012-09-26 ENCOUNTER — Encounter: Payer: Self-pay | Admitting: Internal Medicine

## 2012-09-26 ENCOUNTER — Ambulatory Visit (INDEPENDENT_AMBULATORY_CARE_PROVIDER_SITE_OTHER): Payer: Medicare Other | Admitting: Internal Medicine

## 2012-09-26 VITALS — BP 102/62 | HR 65 | Temp 98.5°F | Resp 18 | Wt 180.0 lb

## 2012-09-26 DIAGNOSIS — N898 Other specified noninflammatory disorders of vagina: Secondary | ICD-10-CM

## 2012-09-26 DIAGNOSIS — M7989 Other specified soft tissue disorders: Secondary | ICD-10-CM | POA: Insufficient documentation

## 2012-09-26 MED ORDER — METRONIDAZOLE 500 MG PO TABS: 500.0000 mg | ORAL_TABLET | Freq: Two times a day (BID) | ORAL | Status: DC

## 2012-09-26 MED ORDER — FUROSEMIDE 20 MG PO TABS: 20.0000 mg | ORAL_TABLET | Freq: Every day | ORAL | Status: DC

## 2012-09-26 NOTE — Progress Notes (Signed)
  Subjective:    Patient ID: Carrie Stephens, female    DOB: 1920-12-21, 78 y.o.   MRN: [id number]  HPI patient presents to clinic for evaluation of vaginal discharge and worsening leg swelling. History primarily provided by daughter due to the patient's history of dementia. Notes intermittently visualizing brown and yellow discharge in underwear thought to be a vaginal origins. Has not seen any discharge over the past several days. Family has no concerns for voluntary or involuntary sexual activity. No obvious complaints of itching or discomfort. Notes worsening bilateral lower extremity swelling after decrease of Lasix dose. Swelling causes discomfort the legs. Denies shortness of breath.  Past Medical History  Diagnosis Date  . S/P lumpectomy of breast 01/2006    and sentinel node dissection   . Radiation 04/2006    s/p external radiation -Arimidex since 04/12/2006  . Alzheimer disease     moderate to severe  . Hyperlipidemia   . Depression   . History of UTI   . Vision loss, left eye     secondary to retinal hemorrhage, unclear etiology  . Osteoporosis     s/p zometa  3.3 mg IV 05/2007  . Thyroid nodule   . 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 Vaginal hysterectomy 1960's    status post  . 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 She has never used smokeless tobacco. Her alcohol and drug histories not on file. family history includes Breast cancer in her mother and Diabetes in her father.  There is no history of Other. Allergies  Allergen Reactions  . Codeine Phosphate     REACTION: unspecified  . Ibuprofen     REACTION: unspecified  . Moxifloxacin     REACTION: rash     Review of  Systems     Objective:   Physical Exam  Nursing note and vitals reviewed. Constitutional: She appears well-developed and well-nourished. No distress.  HENT:  Head: Normocephalic and atraumatic.  Neck: No JVD present.  Neurological: She is alert.  Skin: Skin is warm and dry. She is not diaphoretic.       Plus one bilateral lower extremity edema primarily pedal  Psychiatric: She has a normal mood and affect.          Assessment & Plan:

## 2012-09-26 NOTE — Assessment & Plan Note (Signed)
Long discussion held with daughter and patient. Given her advanced age and history of dementia we all agree to try to avoid pelvic exam if at all possible. Given lack of sexual activity do not suspect STD. Clinical presentation not suggestive strongly of yeast vaginitis. Recommend empiric treatment for bacterial vaginosis with Flagyl. Notify clinic if symptoms not resolved after completion of antibiotics.

## 2012-09-26 NOTE — Assessment & Plan Note (Signed)
Resume chronic dose of Lasix 20 mg by mouth every morning. Advise family to monitor blood pressure in attempt to avoid hypotension.

## 2012-11-04 ENCOUNTER — Encounter: Payer: Self-pay | Admitting: Internal Medicine

## 2012-11-04 ENCOUNTER — Ambulatory Visit (INDEPENDENT_AMBULATORY_CARE_PROVIDER_SITE_OTHER): Payer: Medicare Other | Admitting: Internal Medicine

## 2012-11-04 VITALS — BP 112/76 | HR 63 | Temp 97.9°F | Resp 16 | Wt 173.2 lb

## 2012-11-04 DIAGNOSIS — R319 Hematuria, unspecified: Secondary | ICD-10-CM

## 2012-11-04 MED ORDER — CEPHALEXIN 500 MG PO CAPS: 500.0000 mg | ORAL_CAPSULE | Freq: Two times a day (BID) | ORAL | Status: DC

## 2012-11-04 NOTE — Patient Instructions (Addendum)
Please return for a repeat urinalysis with reflex micro (dx-hematuria) after completing your antibiotic

## 2012-11-05 LAB — POCT URINALYSIS DIPSTICK
Blood, UA: NEGATIVE
Ketones, UA: NEGATIVE
Spec Grav, UA: >=1.03
Urobilinogen, UA: 0.2
pH, UA: 5

## 2012-11-08 DIAGNOSIS — R319 Hematuria, unspecified: Secondary | ICD-10-CM | POA: Insufficient documentation

## 2012-11-08 NOTE — Assessment & Plan Note (Signed)
A. symptomatically. Multiple attempts at obtaining urinalysis unsuccessful.  daughter will return urine sample for UA and culture tomorrow. Begin empiric antibiotic today. Begin Keflex based on past urine cultures. If symptoms persist despite antibiotic proceed with urology evaluation. Monitor for decreased urine output.

## 2012-11-08 NOTE — Progress Notes (Signed)
  Subjective:    Patient ID: Carrie Stephens, female    DOB: 08-13-1920, 78 y.o.   MRN: [id number]  HPI patient presents to clinic for evaluation of hematuria. History provided by daughter. As noted two episodes of gross hematuria yesterday and none noted today. No decrease in urine output. No fever chills, nausea vomiting, dysuria or other change in urination. Denies abdominal or flank pain. No other alleviating or exacerbating factors. Takes no anticoagulation other than aspirin 81 mg a day.  Past Medical History  Diagnosis Date  . S/P lumpectomy of breast 01/2006    and sentinel node dissection   . Radiation 04/2006    s/p external radiation -Arimidex since 04/12/2006  . Alzheimer disease     moderate to severe  . Hyperlipidemia   . Depression   . History of UTI   . Vision loss, left eye     secondary to retinal hemorrhage, unclear etiology  . Osteoporosis     s/p zometa  3.3 mg IV 05/2007  . Thyroid nodule   . 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 Vaginal hysterectomy 1960's    status post  . 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 She has never used smokeless tobacco. Her alcohol and drug histories not on file. family history includes Breast cancer in her mother and Diabetes in her father.  There is no history of Other. Allergies  Allergen Reactions  . Codeine Phosphate     REACTION: unspecified  . Ibuprofen     REACTION: unspecified  . Moxifloxacin     REACTION: rash     Review of Systems see history of present illness     Objective:   Physical Exam  Nursing note and vitals reviewed. Constitutional: She appears well-developed and well-nourished. No distress.  HENT:  Head:  Normocephalic and atraumatic.  Abdominal: Soft. She exhibits no distension. There is no tenderness.  Neurological: She is alert.  Skin: Skin is warm and dry. She is not diaphoretic.  Psychiatric: She has a normal mood and affect.          Assessment & Plan:

## 2012-11-12 ENCOUNTER — Telehealth: Payer: Self-pay | Admitting: *Deleted

## 2012-11-12 DIAGNOSIS — R319 Hematuria, unspecified: Secondary | ICD-10-CM

## 2012-11-12 NOTE — Telephone Encounter (Signed)
Pt called stating she will go to the [facility] for repeat u/a as it is closer to her home. Future order entered.

## 2012-11-13 ENCOUNTER — Telehealth: Payer: Self-pay | Admitting: Internal Medicine

## 2012-11-13 ENCOUNTER — Other Ambulatory Visit: Payer: Self-pay | Admitting: Internal Medicine

## 2012-11-13 DIAGNOSIS — R310 Gross hematuria: Secondary | ICD-10-CM

## 2012-11-13 NOTE — Telephone Encounter (Signed)
Referral order placed for urology consult

## 2012-11-13 NOTE — Telephone Encounter (Signed)
Daughter called to say there is still a significant amount of blood in the urine.  She is not going to take her for urinalysis as she already is sure there is still blood in the urine.

## 2012-11-14 NOTE — Telephone Encounter (Signed)
Pt's daughter informed & states she has already spoken w/Helen/[facility] about getting pt in [facility] today/SLS

## 2012-11-14 NOTE — Progress Notes (Signed)
Pt's daughter call back to inform  That pt cannot get OV until Monday 11.25.13 at [facility] & staff recommended they call our office and ask if it would be [facility] for pt to stop ASA 81 mg until seen in Urology d/t hematuria; caller informed per [facility], Ok to stop ASA but it will not stop bleeding seen in urine/toilet, caller understood & agreed/SLS

## 2012-11-24 ENCOUNTER — Ambulatory Visit (INDEPENDENT_AMBULATORY_CARE_PROVIDER_SITE_OTHER): Payer: Medicare Other | Admitting: Ophthalmology

## 2012-12-10 ENCOUNTER — Telehealth: Payer: Self-pay | Admitting: *Deleted

## 2012-12-10 NOTE — Telephone Encounter (Signed)
Has an appt here tomorrow

## 2012-12-10 NOTE — Telephone Encounter (Signed)
Received message from pt's daughter on 12/02/12 stating pt was seen by urologist for hematuria and they could not find a reason. They suggested that she see a gynecologist. Pt's daughter is calling to get referral. Please advise.

## 2012-12-11 ENCOUNTER — Encounter: Payer: Self-pay | Admitting: Internal Medicine

## 2012-12-11 ENCOUNTER — Ambulatory Visit (INDEPENDENT_AMBULATORY_CARE_PROVIDER_SITE_OTHER): Payer: Medicare Other | Admitting: Internal Medicine

## 2012-12-11 VITALS — BP 90/54 | HR 58 | Temp 98.4°F | Resp 16 | Wt 174.0 lb

## 2012-12-11 DIAGNOSIS — N898 Other specified noninflammatory disorders of vagina: Secondary | ICD-10-CM

## 2012-12-11 DIAGNOSIS — N939 Abnormal uterine and vaginal bleeding, unspecified: Secondary | ICD-10-CM

## 2012-12-11 NOTE — Progress Notes (Signed)
  Subjective:    Patient ID: Carrie Stephens, female    DOB: Jul 29, 1920, 78 y.o.   MRN: [id number]  HPI Pt presents to clinic for followup of lower bleeding. Seen in the past for what was suspected to be painless gross hematuria. Treated with abx's and ultimately due to persistence of bleeding was evaluated by urology. Underwent CT and cystoscopy without reported abnormality. Was suggested to the family to consider gyn evaluation. Underwent hysterectomy [id number]. Has had no further bleeding. Hx somewhat complicated by h/o dementia. States has received flu vaccine for the season. No active complaints.   Past Medical History  Diagnosis Date  . S/P lumpectomy of breast 01/2006    and sentinel node dissection   . Radiation 04/2006    s/p external radiation -Arimidex since 04/12/2006  . Alzheimer disease     moderate to severe  . Hyperlipidemia   . Depression   . History of UTI   . Vision loss, left eye     secondary to retinal hemorrhage, unclear etiology  . Osteoporosis     s/p zometa  3.3 mg IV 05/2007  . Thyroid nodule   . 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 Vaginal hysterectomy 1960's    status post  . 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 She has never used smokeless tobacco. Her alcohol and drug histories not on file. family history includes Breast cancer in her mother and Diabetes in her father.  There is no history of Other. Allergies  Allergen Reactions  . Codeine Phosphate     REACTION: unspecified  . Ibuprofen     REACTION: unspecified  . Moxifloxacin     REACTION: rash      Review of Systems see hpi     Objective:   Physical Exam  Nursing note and vitals  reviewed. Constitutional: She appears well-developed and well-nourished. No distress.  Neurological: She is alert.  Skin: She is not diaphoretic.  Psychiatric: She has a normal mood and affect.          Assessment & Plan:

## 2012-12-11 NOTE — Assessment & Plan Note (Signed)
Hematuria (neg workup) vs vaginal bleeding. Discussed with pt and family without uterus would recommend at least a pelvic exam. Given pt's age and h/o dementia family wishes to see gyn directly to limit the chance of repeat gyn exams and i agree.

## 2013-01-28 ENCOUNTER — Ambulatory Visit (INDEPENDENT_AMBULATORY_CARE_PROVIDER_SITE_OTHER): Payer: Medicare Other | Admitting: Ophthalmology

## 2013-02-11 ENCOUNTER — Ambulatory Visit (INDEPENDENT_AMBULATORY_CARE_PROVIDER_SITE_OTHER): Payer: Medicare Other | Admitting: Ophthalmology

## 2013-02-25 ENCOUNTER — Ambulatory Visit (INDEPENDENT_AMBULATORY_CARE_PROVIDER_SITE_OTHER): Payer: Medicare Other | Admitting: Ophthalmology

## 2013-02-25 DIAGNOSIS — I10 Essential (primary) hypertension: Secondary | ICD-10-CM

## 2013-02-25 DIAGNOSIS — H348392 Tributary (branch) retinal vein occlusion, unspecified eye, stable: Secondary | ICD-10-CM

## 2013-02-25 DIAGNOSIS — H348192 Central retinal vein occlusion, unspecified eye, stable: Secondary | ICD-10-CM

## 2013-02-25 DIAGNOSIS — H35039 Hypertensive retinopathy, unspecified eye: Secondary | ICD-10-CM

## 2013-02-25 DIAGNOSIS — H43819 Vitreous degeneration, unspecified eye: Secondary | ICD-10-CM

## 2013-03-06 ENCOUNTER — Telehealth: Payer: Self-pay | Admitting: Oncology

## 2013-03-06 NOTE — Telephone Encounter (Signed)
Moved 4/22 appt to AM w/SW due to DM on call. lmonvm for dtr and mailed schedule.

## 2013-03-18 ENCOUNTER — Other Ambulatory Visit: Payer: Self-pay | Admitting: Medical Oncology

## 2013-04-06 ENCOUNTER — Other Ambulatory Visit: Payer: Self-pay | Admitting: Internal Medicine

## 2013-04-07 NOTE — Telephone Encounter (Signed)
Rx request to pharmacy/SLS  

## 2013-04-13 ENCOUNTER — Ambulatory Visit: Payer: Self-pay

## 2013-04-13 ENCOUNTER — Encounter: Payer: Self-pay | Admitting: Pharmacist

## 2013-04-13 ENCOUNTER — Ambulatory Visit (HOSPITAL_BASED_OUTPATIENT_CLINIC_OR_DEPARTMENT_OTHER): Payer: Medicare Other | Admitting: Oncology

## 2013-04-13 ENCOUNTER — Encounter: Payer: Self-pay | Admitting: Oncology

## 2013-04-13 ENCOUNTER — Other Ambulatory Visit (HOSPITAL_BASED_OUTPATIENT_CLINIC_OR_DEPARTMENT_OTHER): Payer: Medicare Other | Admitting: Lab

## 2013-04-13 VITALS — BP 103/65 | HR 64 | Temp 97.1°F | Resp 16 | Ht 68.0 in | Wt 164.9 lb

## 2013-04-13 DIAGNOSIS — Z853 Personal history of malignant neoplasm of breast: Secondary | ICD-10-CM

## 2013-04-13 DIAGNOSIS — M810 Age-related osteoporosis without current pathological fracture: Secondary | ICD-10-CM

## 2013-04-13 DIAGNOSIS — E538 Deficiency of other specified B group vitamins: Secondary | ICD-10-CM

## 2013-04-13 DIAGNOSIS — C50912 Malignant neoplasm of unspecified site of left female breast: Secondary | ICD-10-CM

## 2013-04-13 DIAGNOSIS — C50919 Malignant neoplasm of unspecified site of unspecified female breast: Secondary | ICD-10-CM

## 2013-04-13 DIAGNOSIS — Z87891 Personal history of nicotine dependence: Secondary | ICD-10-CM

## 2013-04-13 LAB — CBC WITH DIFFERENTIAL/PLATELET
BASO%: 2.6 % — ABNORMAL HIGH (ref 0.0–2.0)
EOS%: 3 % (ref 0.0–7.0)
HCT: 42.2 % (ref 34.8–46.6)
LYMPH%: 42.9 % (ref 14.0–49.7)
MCH: 30.5 pg (ref 25.1–34.0)
MCHC: 33.1 g/dL (ref 31.5–36.0)
MCV: 92.4 fL (ref 79.5–101.0)
MONO#: 0.4 10*3/uL (ref 0.1–0.9)
MONO%: 9.2 % (ref 0.0–14.0)
NEUT%: 42.3 % (ref 38.4–76.8)
Platelets: 247 10*3/uL (ref 145–400)
RBC: 4.57 10*6/uL (ref 3.70–5.45)
WBC: 4.1 10*3/uL (ref 3.9–10.3)

## 2013-04-13 LAB — COMPREHENSIVE METABOLIC PANEL (CC13)
AST: 20 U/L (ref 5–34)
BUN: 16.6 mg/dL (ref 7.0–26.0)
Calcium: 9.7 mg/dL (ref 8.4–10.4)
Chloride: 110 mEq/L — ABNORMAL HIGH (ref 98–107)
Creatinine: 1.4 mg/dL — ABNORMAL HIGH (ref 0.6–1.1)

## 2013-04-13 LAB — LACTATE DEHYDROGENASE (CC13): LDH: 156 U/L (ref 125–245)

## 2013-04-13 NOTE — Progress Notes (Signed)
This office note has been dictated.  #[id number]

## 2013-04-13 NOTE — Patient Instructions (Signed)
Zoledronic Acid injection (Hypercalcemia, Oncology) What is this medicine? ZOLEDRONIC ACID (ZOE le dron ik AS id) lowers the amount of calcium loss from bone. It is used to treat too much calcium in your blood from cancer. It is also used to prevent complications of cancer that has spread to the bone. This medicine may be used for other purposes; ask your health care provider or pharmacist if you have questions. What should I tell my health care provider before I take this medicine? They need to know if you have any of these conditions: -aspirin-sensitive asthma -dental disease -kidney disease -an unusual or allergic reaction to zoledronic acid, other medicines, foods, dyes, or preservatives -pregnant or trying to get pregnant -breast-feeding How should I use this medicine? This medicine is for infusion into a vein. It is given by a health care professional in a hospital or clinic setting. Talk to your pediatrician regarding the use of this medicine in children. Special care may be needed. Overdosage: If you think you have taken too much of this medicine contact a poison control center or emergency room at once. NOTE: This medicine is only for you. Do not share this medicine with others. What if I miss a dose? It is important not to miss your dose. Call your doctor or health care professional if you are unable to keep an appointment. What may interact with this medicine? -certain antibiotics given by injection -NSAIDs, medicines for pain and inflammation, like ibuprofen or naproxen -some diuretics like bumetanide, furosemide -teriparatide -thalidomide This list may not describe all possible interactions. Give your health care provider a list of all the medicines, herbs, non-prescription drugs, or dietary supplements you use. Also tell them if you smoke, drink alcohol, or use illegal drugs. Some items may interact with your medicine. What should I watch for while using this medicine? Visit  your doctor or health care professional for regular checkups. It may be some time before you see the benefit from this medicine. Do not stop taking your medicine unless your doctor tells you to. Your doctor may order blood tests or other tests to see how you are doing. Women should inform their doctor if they wish to become pregnant or think they might be pregnant. There is a potential for serious side effects to an unborn child. Talk to your health care professional or pharmacist for more information. You should make sure that you get enough calcium and vitamin D while you are taking this medicine. Discuss the foods you eat and the vitamins you take with your health care professional. Some people who take this medicine have severe bone, joint, and/or muscle pain. This medicine may also increase your risk for a broken thigh bone. Tell your doctor right away if you have pain in your upper leg or groin. Tell your doctor if you have any pain that does not go away or that gets worse. What side effects may I notice from receiving this medicine? Side effects that you should report to your doctor or health care professional as soon as possible: -allergic reactions like skin rash, itching or hives, swelling of the face, lips, or tongue -anxiety, confusion, or depression -breathing problems -changes in vision -feeling faint or lightheaded, falls -jaw burning, cramping, pain -muscle cramps, stiffness, or weakness -trouble passing urine or change in the amount of urine Side effects that usually do not require medical attention (report to your doctor or health care professional if they continue or are bothersome): -bone, joint, or muscle pain -  fever -hair loss -irritation at site where injected -loss of appetite -nausea, vomiting -stomach upset -tired This list may not describe all possible side effects. Call your doctor for medical advice about side effects. You may report side effects to FDA at  1-800-FDA-1088. Where should I keep my medicine? This drug is given in a hospital or clinic and will not be stored at home. NOTE: This sheet is a summary. It may not cover all possible information. If you have questions about this medicine, talk to your doctor, pharmacist, or health care provider.  2013, Elsevier/Gold Standard. (06/08/2011 9:06:58 AM)  

## 2013-04-14 ENCOUNTER — Telehealth: Payer: Self-pay | Admitting: Medical Oncology

## 2013-04-14 ENCOUNTER — Ambulatory Visit: Payer: Medicare Other | Admitting: Physician Assistant

## 2013-04-14 ENCOUNTER — Ambulatory Visit: Payer: Self-pay

## 2013-04-14 ENCOUNTER — Other Ambulatory Visit: Payer: Self-pay | Admitting: Lab

## 2013-04-14 ENCOUNTER — Ambulatory Visit: Payer: Medicare Other | Admitting: Family Medicine

## 2013-04-14 ENCOUNTER — Ambulatory Visit: Payer: Medicare Other | Admitting: Internal Medicine

## 2013-04-14 NOTE — Progress Notes (Signed)
CC:   Carrie Court, MD  PROBLEM LIST:  1. Invasive cancer involving the left breast, stage IIA (T2 N0),  diagnosed when the patient was living in a [place]. Primary tumor  measured 3.0 cm. Lymphovascular and perineural invasion were  present. Estrogen receptor was 93%, progesterone receptor was  weakly positive, HER2/neu was negative. 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 The patient underwent lumpectomy and sentinel  lymph node biopsy on January 25, 2006. She completed radiation  treatments to the left breast from March 21, 2006, through May 08, 2006. The patient was on Arimidex 1 mg daily since April 12, 2006,  and completed this in June 2012. She is on no therapy at this time  and without evidence of recurrent breast cancer.  2. Alzheimer disease with onset at age 59.  3. Osteoporosis detected by a bone density scan in May 2007.  4. History of low vitamin B12 level.  5. Dyslipidemia.  6. Goiter detected in October 2009. The patient is status post right  thyroid lobectomy and isthmectomy on 09/20/2009.  7. History of vaginal hysterectomy in 1964.   MEDICATIONS:  Reviewed and recorded. Current Outpatient Prescriptions  Medication Sig Dispense Refill  . aspirin 81 MG tablet Take 81 mg by mouth daily.        . calcium carbonate (OS-CAL) 600 MG TABS Take 600 mg by mouth 2 (two) times daily with a meal.        . Cholecalciferol (VITAMIN D3) 1000 UNITS tablet Take 1,000 Units by mouth daily.        Marland Kitchen donepezil (ARICEPT) 10 MG tablet Take 1 tablet (10 mg total) by mouth at bedtime.  30 tablet  6  . furosemide (LASIX) 20 MG tablet Take 1 tablet (20 mg total) by mouth daily.  30 tablet  6  . levothyroxine (SYNTHROID, LEVOTHROID) 50 MCG tablet TAKE 1 TABLET (50 MCG TOTAL) BY MOUTH DAILY.  30 tablet  0  . memantine (NAMENDA) 10 MG tablet TAKE 1 TABLET BY MOUTH TWICE A DAY. OFFICE VISIT IS NEEDED FOR REFILLS  60 tablet  6  . mirtazapine (REMERON) 15 MG tablet TAKE 1 TABLET (15 MG  TOTAL) BY MOUTH AT BEDTIME.  30 tablet  0  . potassium chloride (KLOR-CON 10) 10 MEQ tablet Take 1 tablet (10 mEq total) by mouth daily.  30 tablet  6  . simvastatin (ZOCOR) 20 MG tablet TAKE 1 TABLET (20 MG TOTAL) BY MOUTH AT BEDTIME.  30 tablet  0   No current facility-administered medications for this visit.    SMOKING HISTORY:  The patient is a former cigarette smoker.    HISTORY:  I saw Carrie Stephens today for followup of her stage IIA invasive carcinoma involving the left breast dating back to February 2007.  The patient underwent her surgical treatments while she was living in [place].  She received radiation treatments to the left breast here and was on Arimidex for 5 years.  She is on no therapy at this time.  The patient was last seen by [place] on 04/15/2012.  She is accompanied by her daughter, Carrie Stephens, with whom she lives.  The patient has been receiving yearly IV Zometa from [place] for osteoporosis.  The patient has done fairly well over the past year since her last visit.  Over the past few months she has apparently had some vaginal bleeding, has undergone workup by Dr. Chevis Pretty at [facility].  She has also been seen by a gastroenterologist.  She did undergo cystoscopy and apparently had either a CT or an MRI of the pelvis.  Bleeding apparently is intermittent.  Apparently, no major pathology was found to explain the patient's bleeding.  The patient is without complaints.  Her mental functioning has slowly declined over the past several years.  The patient needs some help with bathing and also getting dressed.  She is fairly easily managed, however.  She does not need any assistance with eating.  There are no symptoms or findings to suggest recurrence of her previous breast cancer.  PHYSICAL EXAMINATION:  General:  Patient has lost some weight, approximately 10 pounds over the past year.  This is not obvious from physical exam.  Weight is 164 pounds 14.4  ounces, height 5 feet 8 inches, body surface area 1.89 sq m.  Vital Signs:  Blood pressure 103/65.  Other vital signs are normal.  HEENT:  There is no scleral icterus.  Mouth and pharynx are benign.  There is no peripheral adenopathy palpable.  Heart and lungs:  Normal.  Breasts:  Right breast is benign.  Left breast continues to show changes consistent with surgery at the 6 o'clock position where there is some dimpling and distortion.  The breast remains soft without any findings suspicious for recurrent cancer.  Radiation changes are fairly subtle.  No axillary adenopathy.  Abdomen:  Obese, nontender, with no organomegaly or masses palpable.  Extremities:  No peripheral edema or clubbing.  No obvious lymphedema in the left arm.  Neurologic:  Exam is nonfocal.  Skin:  The patient has seborrheic keratoses over her trunk.  LABORATORY DATA:  Today, white count 4.1, ANC 1.7, hemoglobin 13.9, hematocrit 42.2, platelets 247,000.  Chemistries today are pending, as is a vitamin B12 level.  Vitamin B12 level on 11/30/2009 was 642; however, on 05/01/2007 the vitamin B12 level was 146.  IMAGING STUDIES:  1. Bone density scan from 05/25/2010 showed a T-score for the left  forearm of -1.2. T-score for the left femoral neck was -2.0.  2. Digital diagnostic bilateral mammogram on 05/25/2010 was negative.  3. Chest x-ray, 2 view from 12/03/2011, showed right middle lobe  infiltrate compatible with pneumonia. Chest x-ray from 03/22/2010  and was negative, did not show airspace disease. In addition,  there was a small right pleural effusion noted on the chest x-ray  of 12/03/2011. Chest x-ray will be repeated today. 4. Chest x-ray, 2 view, from 04/15/2012 showed resolution of a right middle lobe infiltrate.  There was no active lung disease.  There was stable mild cardiomegaly.   IMPRESSION AND PLAN:  Carrie Stephens continues to do well now over 7 years from the time of diagnosis of her invasive cancer  involving the left breast.  There is no evidence for recurrent disease.  The patient will receive IV Zometa today 4 mg IV.  I am not sure whether the patient really needs to continue this at this time.  I spoke with the patient's daughter about ongoing followup.  I do not think it is absolutely necessary that Carrie Stephens be followed by [place]. She is not having mammograms at this time.  I will be happy to see her again should any problems or questions arise in the future, but I think at this point she can be followed by her primary care physician.   ADDENDUM:  The patient was stuck several times in an unsuccessful attempt to get an IV started for her Zometa infusion.  The patient did not receive Zometa.  Future orders have been cancelled.   ______________________________ Samul Dada, M.D. DSM/MEDQ  D:  04/13/2013  T:  04/14/2013  Job:  [id number]

## 2013-04-14 NOTE — Telephone Encounter (Signed)
I called and spoke with Carrie Stephens-daughter to inform her Dr. Arline Asp would like for her mother to take Vit B12 1000 mch daily. I explained you can buy this where the vitamins are sold. She voiced understanding.

## 2013-04-16 ENCOUNTER — Ambulatory Visit (INDEPENDENT_AMBULATORY_CARE_PROVIDER_SITE_OTHER): Payer: Medicare Other | Admitting: Family Medicine

## 2013-04-16 ENCOUNTER — Encounter: Payer: Self-pay | Admitting: Family Medicine

## 2013-04-16 VITALS — BP 122/58 | HR 78 | Temp 97.8°F | Ht 68.0 in | Wt 167.1 lb

## 2013-04-16 DIAGNOSIS — C50919 Malignant neoplasm of unspecified site of unspecified female breast: Secondary | ICD-10-CM

## 2013-04-16 DIAGNOSIS — E785 Hyperlipidemia, unspecified: Secondary | ICD-10-CM

## 2013-04-16 DIAGNOSIS — G309 Alzheimer's disease, unspecified: Secondary | ICD-10-CM

## 2013-04-16 DIAGNOSIS — F0280 Dementia in other diseases classified elsewhere without behavioral disturbance: Secondary | ICD-10-CM

## 2013-04-16 DIAGNOSIS — R319 Hematuria, unspecified: Secondary | ICD-10-CM

## 2013-04-16 MED ORDER — SIMVASTATIN 5 MG PO TABS: 5.0000 mg | ORAL_TABLET | Freq: Every day | ORAL | Status: DC

## 2013-04-16 NOTE — Patient Instructions (Addendum)
Next 4-6 months, labs cholesterol, hgba1c, renal, hepatic, cbc, tsh    Cholesterol Cholesterol is a white, waxy, fat-like protein needed by your body in small amounts. The liver makes all the cholesterol you need. It is carried from the liver by the blood through the blood vessels. Deposits (plaque) may build up on blood vessel walls. This makes the arteries narrower and stiffer. Plaque increases the risk for heart attack and stroke. You cannot feel your cholesterol level even if it is very high. The only way to know is by a blood test to check your lipid (fats) levels. Once you know your cholesterol levels, you should keep a record of the test results. Work with your caregiver to to keep your levels in the desired range. WHAT THE RESULTS MEAN:  Total cholesterol is a rough measure of all the cholesterol in your blood.  LDL is the so-called bad cholesterol. This is the type that deposits cholesterol in the walls of the arteries. You want this level to be low.  HDL is the good cholesterol because it cleans the arteries and carries the LDL away. You want this level to be high.  Triglycerides are fat that the body can either burn for energy or store. High levels are closely linked to heart disease. DESIRED LEVELS:  Total cholesterol below 200.  LDL below 100 for people at risk, below 70 for very high risk.  HDL above 50 is good, above 60 is best.  Triglycerides below 150. HOW TO LOWER YOUR CHOLESTEROL:  Diet.  Choose fish or white meat chicken and [place], roasted or baked. Limit fatty cuts of red meat, fried foods, and processed meats, such as sausage and lunch meat.  Eat lots of fresh fruits and vegetables. Choose whole grains, beans, pasta, potatoes and cereals.  Use only small amounts of olive, corn or canola oils. Avoid butter, mayonnaise, shortening or palm kernel oils. Avoid foods with trans-fats.  Use skim/nonfat milk and low-fat/nonfat yogurt and cheeses. Avoid whole milk,  cream, ice cream, egg yolks and cheeses. Healthy desserts include angel food cake, ginger snaps, animal crackers, hard candy, popsicles, and low-fat/nonfat frozen yogurt. Avoid pastries, cakes, pies and cookies.  Exercise.  A regular program helps decrease LDL and raises HDL.  Helps with weight control.  Do things that increase your activity level like gardening, walking, or taking the stairs.  Medication.  May be prescribed by your caregiver to help lowering cholesterol and the risk for heart disease.  You may need medicine even if your levels are normal if you have several risk factors. HOME CARE INSTRUCTIONS   Follow your diet and exercise programs as suggested by your caregiver.  Take medications as directed.  Have blood work done when your caregiver feels it is necessary. MAKE SURE YOU:   Understand these instructions.  Will watch your condition.  Will get help right away if you are not doing well or get worse. Document Released: 09/04/2001 Document Revised: 03/03/2012 Document Reviewed: 02/25/2008 [facility] Patient Information 2013 [place], [place].

## 2013-04-17 ENCOUNTER — Telehealth: Payer: Self-pay

## 2013-04-17 NOTE — Telephone Encounter (Signed)
S/w daughter that Dr Arline Asp cancelled zometa appt for 4/28 d/t hard iv access. See Dr Mamie Levers OV note 04/13/13.Daughter expressed understanding.

## 2013-04-18 NOTE — Assessment & Plan Note (Signed)
Tolerating Zocor but will drop dose to 5 mg to minimize risk of medication

## 2013-04-18 NOTE — Assessment & Plan Note (Signed)
Has undergone work up at [facility] with cystoscopy and CT scan no concerning cause found

## 2013-04-18 NOTE — Assessment & Plan Note (Signed)
Just released by hem/onc secondary to no recurrence of disease.

## 2013-04-18 NOTE — Assessment & Plan Note (Signed)
Lives with her daughter who accompanies her today. She is slowly progressive disease but is doing well in the home.

## 2013-04-18 NOTE — Progress Notes (Signed)
Patient ID: Carrie Stephens, female   DOB: 08-29-1920, 79 y.o.   MRN: [id number] MYSTIC LABO [id number] Sep 26, 1920 04/18/2013      Progress Note-Follow Up  Subjective  Chief Complaint  Chief Complaint  Patient presents with  . Follow-up    HPI  Patient is a 79 year old African American female who is in today accompanied by her daughter. She has dementia and lives in the home with her daughter. It is slowly progressive but the patient is unable to perform any of her own ADLs without significant direction. No recent illness or acute complaints. No chest pain or palpitations. No shortness or breath GI or GU complaints. Has been seen by urology and gynecology for some vaginal bleeding but no concerning cause has been found.  Past Medical History  Diagnosis Date  . S/P lumpectomy of breast 01/2006    and sentinel node dissection   . Radiation 04/2006    s/p external radiation -Arimidex since 04/12/2006  . Alzheimer disease     moderate to severe  . Hyperlipidemia   . Depression   . History of UTI   . Vision loss, left eye     secondary to retinal hemorrhage, unclear etiology  . Osteoporosis     s/p zometa  3.3 mg IV 05/2007  . Thyroid nodule   . Breast cancer     status post lumpectomy left side February of 2007 Stage II A invasice carcinoma of left breast with ductal  carcinoma in situ, Arimidex since  04/12/06    Past Surgical History  Procedure Laterality Date  . Vaginal hysterectomy  1960's    status post  . Lobectomy  08/2009    thyroid right-mixed and macrofollicular adenoma, 8.5 cm, malignant features are not identified    Family History  Problem Relation Age of Onset  . Breast cancer Mother   . Diabetes Father     adult onset type 2  . Other Neg Hx     No report of heart disease or stroke in the family    History   Social History  . Marital Status: Widowed    Spouse Name: N/A    Number of Children: 3  . Years of Education: N/A   Occupational History  .  Retired     [employer]   Social History Main Topics  . Smoking status: Former Games developer  . Smokeless tobacco: Never Used     Comment: light smoker- smoked 3 or 4 cigarettes a day for 55 years  . Alcohol Use: Not on file  . Drug Use: Not on file  . Sexually Active: Not on file   Other Topics Concern  . Not on file   Social History Narrative   Patient is widowed, is the retired Psychologist, forensic   currently lives with her daughter.  She has a total of 3 children, two daughters and one son.     No alcohol.  She quit tobacco approximately 2 years ago.  She   was a light smoker, smoked 3 or 4 cigarettes a day for 55 years.      Current Outpatient Prescriptions on File Prior to Visit  Medication Sig Dispense Refill  . aspirin 81 MG tablet Take 81 mg by mouth daily.        . calcium carbonate (OS-CAL) 600 MG TABS Take 600 mg by mouth 2 (two) times daily with a meal.        . Cholecalciferol (VITAMIN D3) 1000 UNITS  tablet Take 1,000 Units by mouth daily.        Marland Kitchen donepezil (ARICEPT) 10 MG tablet Take 1 tablet (10 mg total) by mouth at bedtime.  30 tablet  6  . furosemide (LASIX) 20 MG tablet Take 1 tablet (20 mg total) by mouth daily.  30 tablet  6  . levothyroxine (SYNTHROID, LEVOTHROID) 50 MCG tablet TAKE 1 TABLET (50 MCG TOTAL) BY MOUTH DAILY.  30 tablet  0  . memantine (NAMENDA) 10 MG tablet TAKE 1 TABLET BY MOUTH TWICE A DAY. OFFICE VISIT IS NEEDED FOR REFILLS  60 tablet  6  . mirtazapine (REMERON) 15 MG tablet TAKE 1 TABLET (15 MG TOTAL) BY MOUTH AT BEDTIME.  30 tablet  0  . potassium chloride (KLOR-CON 10) 10 MEQ tablet Take 1 tablet (10 mEq total) by mouth daily.  30 tablet  6   No current facility-administered medications on file prior to visit.    Allergies  Allergen Reactions  . Codeine Phosphate     REACTION: unspecified  . Ibuprofen     REACTION: unspecified  . Moxifloxacin     REACTION: rash    Review of Systems  Review of Systems  Constitutional:  Negative for fever and malaise/fatigue.  HENT: Negative for congestion.   Eyes: Negative for discharge.  Respiratory: Negative for shortness of breath.   Cardiovascular: Negative for chest pain, palpitations and leg swelling.  Gastrointestinal: Negative for nausea, abdominal pain and diarrhea.  Genitourinary: Negative for dysuria.  Musculoskeletal: Negative for falls.  Skin: Negative for rash.  Neurological: Negative for loss of consciousness and headaches.  Endo/Heme/Allergies: Negative for polydipsia.  Psychiatric/Behavioral: Positive for memory loss. Negative for depression and suicidal ideas. The patient is not nervous/anxious and does not have insomnia.     Objective  BP 122/58  Pulse 78  Temp(Src) 97.8 F (36.6 C) (Oral)  Ht 5\' 8"  (1.727 m)  Wt 167 lb 1.9 oz (75.805 kg)  BMI 25.42 kg/m2  SpO2 94%  Physical Exam  Physical Exam  Constitutional: She is oriented to person, place, and time and well-developed, well-nourished, and in no distress. No distress.  HENT:  Head: Normocephalic and atraumatic.  Eyes: Conjunctivae are normal.  Neck: Neck supple. No thyromegaly present.  Cardiovascular: Normal rate, regular rhythm and normal heart sounds.   Pulmonary/Chest: Effort normal and breath sounds normal. She has no wheezes.  Abdominal: She exhibits no distension and no mass.  Musculoskeletal: She exhibits no edema.  Lymphadenopathy:    She has no cervical adenopathy.  Neurological: She is alert and oriented to person, place, and time.  Skin: Skin is warm and dry. No rash noted. She is not diaphoretic.  Psychiatric: Memory, affect and judgment normal.    Lab Results  Component Value Date   TSH 1.326 09/04/2012   Lab Results  Component Value Date   WBC 4.1 04/13/2013   HGB 13.9 04/13/2013   HCT 42.2 04/13/2013   MCV 92.4 04/13/2013   PLT 247 04/13/2013   Lab Results  Component Value Date   CREATININE 1.4* 04/13/2013   BUN 16.6 04/13/2013   NA 147* 04/13/2013   K 3.9  04/13/2013   CL 110* 04/13/2013   CO2 24 04/13/2013   Lab Results  Component Value Date   ALT 10 04/13/2013   AST 20 04/13/2013   ALKPHOS 56 04/13/2013   BILITOT 0.76 04/13/2013   Lab Results  Component Value Date   CHOL 156 09/04/2012   Lab Results  Component Value  Date   HDL 57 09/04/2012   Lab Results  Component Value Date   LDLCALC 43 09/04/2012   Lab Results  Component Value Date   TRIG 279* 09/04/2012   Lab Results  Component Value Date   CHOLHDL 2.7 09/04/2012     Assessment & Plan  NEOPLASM, MALIGNANT, BREAST, LEFT Just released by hem/onc secondary to no recurrence of disease.  Hematuria Has undergone work up at [facility] with cystoscopy and CT scan no concerning cause found  HYPERLIPIDEMIA Tolerating Zocor but will drop dose to 5 mg to minimize risk of medication   ALZHEIMER'S DISEASE Lives with her daughter who accompanies her today. She is slowly progressive disease but is doing well in the home.

## 2013-04-20 ENCOUNTER — Ambulatory Visit: Payer: Self-pay

## 2013-04-29 ENCOUNTER — Telehealth: Payer: Self-pay | Admitting: *Deleted

## 2013-04-29 NOTE — Telephone Encounter (Signed)
Received message from pt's daughter, Carrie Stephens stating they received letter from insurance that Namenda immediate release is not going be manufactured any longer and possible alternative is Namenda XR.  Please advise

## 2013-04-29 NOTE — Telephone Encounter (Signed)
We can switch her to the extended release Namenda would do the highest dose, 1 tab po daily disp #30, 3 rf

## 2013-04-30 MED ORDER — MEMANTINE HCL ER 28 MG PO CP24: 1.0000 | ORAL_CAPSULE | Freq: Every day | ORAL | Status: DC

## 2013-05-13 ENCOUNTER — Other Ambulatory Visit: Payer: Self-pay | Admitting: Internal Medicine

## 2013-05-13 ENCOUNTER — Other Ambulatory Visit: Payer: Self-pay | Admitting: Family Medicine

## 2013-05-14 NOTE — Telephone Encounter (Signed)
Received call from pt's daughter requesting status of medication refill request as they are leaving to go out of town. Advised her that 4 medications were just refilled. Reviewed additional meds on list and she states she has 1 refill left on pt's aricept which she is picking up today. She will have pharmacy send [place] a request when medication is due again.

## 2013-05-14 NOTE — Addendum Note (Signed)
Addended by: Mervin Kung A on: 05/14/2013 10:10 AM   Modules accepted: Orders

## 2013-06-16 ENCOUNTER — Other Ambulatory Visit: Payer: Self-pay | Admitting: Internal Medicine

## 2013-08-26 ENCOUNTER — Other Ambulatory Visit: Payer: Self-pay | Admitting: Family Medicine

## 2013-09-08 ENCOUNTER — Other Ambulatory Visit: Payer: Self-pay | Admitting: Family Medicine

## 2013-10-19 ENCOUNTER — Ambulatory Visit: Payer: Medicare Other | Admitting: Family Medicine

## 2013-11-11 ENCOUNTER — Encounter: Payer: Self-pay | Admitting: Family Medicine

## 2013-11-11 ENCOUNTER — Ambulatory Visit (INDEPENDENT_AMBULATORY_CARE_PROVIDER_SITE_OTHER): Payer: Medicare Other | Admitting: Family Medicine

## 2013-11-11 ENCOUNTER — Telehealth: Payer: Self-pay | Admitting: Family Medicine

## 2013-11-11 VITALS — BP 102/68 | HR 61 | Temp 98.0°F | Ht 68.0 in | Wt 169.1 lb

## 2013-11-11 DIAGNOSIS — E039 Hypothyroidism, unspecified: Secondary | ICD-10-CM

## 2013-11-11 DIAGNOSIS — F0280 Dementia in other diseases classified elsewhere without behavioral disturbance: Secondary | ICD-10-CM

## 2013-11-11 DIAGNOSIS — Z23 Encounter for immunization: Secondary | ICD-10-CM

## 2013-11-11 DIAGNOSIS — E785 Hyperlipidemia, unspecified: Secondary | ICD-10-CM

## 2013-11-11 DIAGNOSIS — R739 Hyperglycemia, unspecified: Secondary | ICD-10-CM

## 2013-11-11 DIAGNOSIS — Z0000 Encounter for general adult medical examination without abnormal findings: Secondary | ICD-10-CM

## 2013-11-11 DIAGNOSIS — R7309 Other abnormal glucose: Secondary | ICD-10-CM

## 2013-11-11 DIAGNOSIS — R351 Nocturia: Secondary | ICD-10-CM

## 2013-11-11 DIAGNOSIS — N289 Disorder of kidney and ureter, unspecified: Secondary | ICD-10-CM

## 2013-11-11 NOTE — Patient Instructions (Signed)

## 2013-11-11 NOTE — Telephone Encounter (Signed)
Lab order week of 04-27-2014 at [facility] with labs at [facility] prior to next visit. Lipid, renal, cbc, tsh, hepatic, freeT4

## 2013-11-11 NOTE — Progress Notes (Signed)
Pre visit review using our clinic review tool, if applicable. No additional management support is needed unless otherwise documented below in the visit note. 

## 2013-11-12 NOTE — Telephone Encounter (Signed)
Orders entered

## 2013-11-13 ENCOUNTER — Other Ambulatory Visit: Payer: Self-pay | Admitting: *Deleted

## 2013-11-13 MED ORDER — FUROSEMIDE 20 MG PO TABS: ORAL_TABLET | ORAL | Status: DC

## 2013-11-13 MED ORDER — LEVOTHYROXINE SODIUM 50 MCG PO TABS: ORAL_TABLET | ORAL | Status: DC

## 2013-11-13 MED ORDER — POTASSIUM CHLORIDE CRYS ER 10 MEQ PO TBCR: EXTENDED_RELEASE_TABLET | ORAL | Status: DC

## 2013-11-13 MED ORDER — MIRTAZAPINE 15 MG PO TABS: ORAL_TABLET | ORAL | Status: DC

## 2013-11-13 NOTE — Telephone Encounter (Signed)
Rx request to pharmacy/SLS  

## 2013-11-15 ENCOUNTER — Encounter: Payer: Self-pay | Admitting: Family Medicine

## 2013-11-15 DIAGNOSIS — R739 Hyperglycemia, unspecified: Secondary | ICD-10-CM

## 2013-11-15 DIAGNOSIS — N289 Disorder of kidney and ureter, unspecified: Secondary | ICD-10-CM

## 2013-11-15 HISTORY — DX: Disorder of kidney and ureter, unspecified: N28.9

## 2013-11-15 HISTORY — DX: Hyperglycemia, unspecified: R73.9

## 2013-11-15 NOTE — Assessment & Plan Note (Signed)
Tolerating Namenda and Aricept. Continue same

## 2013-11-15 NOTE — Assessment & Plan Note (Signed)
Will continue to monitor needs to maintain adequate hydration

## 2013-11-15 NOTE — Assessment & Plan Note (Signed)
Tolerating Simvastatin

## 2013-11-15 NOTE — Assessment & Plan Note (Signed)
Well controlled on current dose of levothyroxine

## 2013-11-15 NOTE — Progress Notes (Signed)
Patient ID: Carrie Stephens, female   DOB: 1920-10-18, 79 y.o.   MRN: [id number] Carrie Stephens [id number] 16-Aug-1920 11/15/2013      Progress Note-Follow Up  Subjective  Chief Complaint  Chief Complaint  Patient presents with  . Follow-up    6 month  . Injections    tdap    HPI  Female who is in today for followup. In today for me my family. Doing well. No recent illness. Taking medications as prescribed. Denies headaches, chest pain, palpitations, shortness of breath, GI or GU concerns at this time.  Past Medical History  Diagnosis Date  . S/P lumpectomy of breast 01/2006    and sentinel node dissection   . Radiation 04/2006    s/p external radiation -Arimidex since 04/12/2006  . Alzheimer disease     moderate to severe  . Hyperlipidemia   . Depression   . History of UTI   . Vision loss, left eye     secondary to retinal hemorrhage, unclear etiology  . Osteoporosis     s/p zometa  3.3 mg IV 05/2007  . Thyroid nodule   . 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 Renal insufficiency 11/15/2013  . Hyperglycemia 11/15/2013    Past Surgical History  Procedure Laterality Date  . Vaginal hysterectomy  1960's    status post  . Lobectomy  08/2009    thyroid right-mixed and macrofollicular adenoma, 8.5 cm, malignant features are not identified    Family History  Problem Relation Age of Onset  . Breast cancer Mother   . Diabetes Father     adult onset type 2  . Other Neg Hx     No report of heart disease or stroke in the family    History   Social History  . Marital Status: Widowed    Spouse Name: N/A    Number of Children: 3  . Years of Education: N/A   Occupational History  . Retired     [employer]   Social History Main Topics  . Smoking status: Former Games developer  . Smokeless tobacco: Never Used     Comment: light smoker-  smoked 3 or 4 cigarettes a day for 55 years  . Alcohol Use: Not on file  . Drug Use: Not on file  . Sexual Activity: Not on file   Other Topics Concern  . Not on file   Social History Narrative   Patient is widowed, is the retired Psychologist, forensic   currently lives with her daughter.  She has a total of 3 children, two daughters and one son.     No alcohol.  She quit tobacco approximately 2 years ago.  She   was a light smoker, smoked 3 or 4 cigarettes a day for 55 years.      Current Outpatient Prescriptions on File Prior to Visit  Medication Sig Dispense Refill  . aspirin 81 MG tablet Take 81 mg by mouth daily.        . calcium carbonate (OS-CAL) 600 MG TABS Take 600 mg by mouth 2 (two) times daily with a meal.        . Cholecalciferol (VITAMIN D3) 1000 UNITS tablet Take 1,000 Units by mouth daily.        Marland Kitchen donepezil (ARICEPT) 10 MG tablet TAKE 1 TABLET (10  MG TOTAL) BY MOUTH AT BEDTIME.  30 tablet  2  . donepezil (ARICEPT) 10 MG tablet TAKE 1 TABLET BY MOUTH EVERY DAY AT BEDTIME  30 tablet  1  . NAMENDA XR 28 MG CP24 TAKE ONE CAPSULE BY MOUTH DAILY  30 capsule  3  . simvastatin (ZOCOR) 5 MG tablet Take 1 tablet (5 mg total) by mouth at bedtime.  30 tablet  5   No current facility-administered medications on file prior to visit.    Allergies  Allergen Reactions  . Codeine Phosphate     REACTION: unspecified  . Ibuprofen     REACTION: unspecified  . Moxifloxacin     REACTION: rash    Review of Systems  Review of Systems  Constitutional: Negative for fever and malaise/fatigue.  HENT: Negative for congestion.   Eyes: Negative for discharge.  Respiratory: Negative for shortness of breath.   Cardiovascular: Negative for chest pain, palpitations and leg swelling.  Gastrointestinal: Negative for nausea, abdominal pain and diarrhea.  Genitourinary: Negative for dysuria.  Musculoskeletal: Negative for falls.  Skin: Negative for rash.  Neurological: Negative for loss of  consciousness and headaches.  Endo/Heme/Allergies: Negative for polydipsia.  Psychiatric/Behavioral: Negative for depression and suicidal ideas. The patient is not nervous/anxious and does not have insomnia.     Objective  BP 102/68  Pulse 61  Temp(Src) 98 F (36.7 C) (Oral)  Ht 5\' 8"  (1.727 m)  Wt 169 lb 1.3 oz (76.694 kg)  BMI 25.71 kg/m2  SpO2 97%  Physical Exam  Physical Exam  Constitutional: She is oriented to person, place, and time and well-developed, well-nourished, and in no distress. No distress.  HENT:  Head: Normocephalic and atraumatic.  Eyes: Conjunctivae are normal.  Neck: Neck supple. No thyromegaly present.  Cardiovascular: Normal rate, regular rhythm and normal heart sounds.   No murmur heard. Pulmonary/Chest: Effort normal and breath sounds normal. She has no wheezes.  Abdominal: She exhibits no distension and no mass.  Musculoskeletal: She exhibits no edema.  Lymphadenopathy:    She has no cervical adenopathy.  Neurological: She is alert and oriented to person, place, and time.  Skin: Skin is warm and dry. No rash noted. She is not diaphoretic.  Psychiatric: Memory, affect and judgment normal.    Lab Results  Component Value Date   TSH 1.326 09/04/2012   Lab Results  Component Value Date   WBC 4.1 04/13/2013   HGB 13.9 04/13/2013   HCT 42.2 04/13/2013   MCV 92.4 04/13/2013   PLT 247 04/13/2013   Lab Results  Component Value Date   CREATININE 1.4* 04/13/2013   BUN 16.6 04/13/2013   NA 147* 04/13/2013   K 3.9 04/13/2013   CL 110* 04/13/2013   CO2 24 04/13/2013   Lab Results  Component Value Date   ALT 10 04/13/2013   AST 20 04/13/2013   ALKPHOS 56 04/13/2013   BILITOT 0.76 04/13/2013   Lab Results  Component Value Date   CHOL 156 09/04/2012   Lab Results  Component Value Date   HDL 57 09/04/2012   Lab Results  Component Value Date   LDLCALC 43 09/04/2012   Lab Results  Component Value Date   TRIG 279* 09/04/2012   Lab Results  Component  Value Date   CHOLHDL 2.7 09/04/2012     Assessment & Plan  ALZHEIMER'S DISEASE Tolerating Namenda and Aricept. Continue same  HYPOTHYROIDISM Well controlled on current dose of levothyroxine  Renal insufficiency Will continue to monitor needs  to maintain adequate hydration  HYPERLIPIDEMIA Tolerating Simvastatin

## 2013-11-16 ENCOUNTER — Other Ambulatory Visit: Payer: Self-pay | Admitting: *Deleted

## 2013-11-16 DIAGNOSIS — E785 Hyperlipidemia, unspecified: Secondary | ICD-10-CM

## 2013-11-16 MED ORDER — SIMVASTATIN 5 MG PO TABS: 5.0000 mg | ORAL_TABLET | Freq: Every day | ORAL | Status: DC

## 2013-11-16 MED ORDER — DONEPEZIL HCL 10 MG PO TABS: ORAL_TABLET | ORAL | Status: DC

## 2013-11-16 NOTE — Telephone Encounter (Signed)
Rx request to pharmacy/SLS  

## 2013-11-17 ENCOUNTER — Telehealth: Payer: Self-pay | Admitting: *Deleted

## 2013-11-17 NOTE — Telephone Encounter (Signed)
Opened in error

## 2014-02-04 ENCOUNTER — Other Ambulatory Visit: Payer: Self-pay | Admitting: Family Medicine

## 2014-02-04 NOTE — Telephone Encounter (Signed)
Rx request to pharmacy/SLS  

## 2014-02-23 ENCOUNTER — Other Ambulatory Visit: Payer: Self-pay | Admitting: Family Medicine

## 2014-03-01 ENCOUNTER — Ambulatory Visit (INDEPENDENT_AMBULATORY_CARE_PROVIDER_SITE_OTHER): Payer: Medicare Other | Admitting: Ophthalmology

## 2014-04-19 ENCOUNTER — Encounter: Payer: Self-pay | Admitting: Nurse Practitioner

## 2014-04-19 ENCOUNTER — Ambulatory Visit (INDEPENDENT_AMBULATORY_CARE_PROVIDER_SITE_OTHER): Payer: Medicare Other | Admitting: Nurse Practitioner

## 2014-04-19 VITALS — BP 93/58 | HR 65 | Temp 97.4°F | Ht 68.0 in | Wt 159.0 lb

## 2014-04-19 DIAGNOSIS — R350 Frequency of micturition: Secondary | ICD-10-CM

## 2014-04-19 DIAGNOSIS — IMO0001 Reserved for inherently not codable concepts without codable children: Secondary | ICD-10-CM

## 2014-04-19 LAB — POCT URINALYSIS DIPSTICK
Glucose, UA: NEGATIVE
KETONES UA: 15
Nitrite, UA: NEGATIVE
PROTEIN UA: NEGATIVE
Spec Grav, UA: >=1.03
Urobilinogen, UA: 0.2
pH, UA: 6

## 2014-04-19 MED ORDER — SULFAMETHOXAZOLE-TRIMETHOPRIM 400-80 MG PO TABS: 1.0000 | ORAL_TABLET | Freq: Two times a day (BID) | ORAL | Status: DC

## 2014-04-19 NOTE — Progress Notes (Signed)
Pre visit review using our clinic review tool, if applicable. No additional management support is needed unless otherwise documented below in the visit note. 

## 2014-04-19 NOTE — Patient Instructions (Signed)
Start antibiotic. Our office will call if we need to change the antibiotic. Sip hydrating fluids (water, juice, colorless soda, decaff tea) every hour to flush kidneys. Return in 2 weeks to recheck urine or sooner if symptoms do not improve or you feel worse.  Urinary Tract Infection Urinary tract infections (UTIs) can develop anywhere along your urinary tract. Your urinary tract is your body's drainage system for removing wastes and extra water. Your urinary tract includes two kidneys, two ureters, a bladder, and a urethra. Your kidneys are a pair of bean-shaped organs. Each kidney is about the size of your fist. They are located below your ribs, one on each side of your spine. CAUSES Infections are caused by microbes, which are microscopic organisms, including fungi, viruses, and bacteria. These organisms are so small that they can only be seen through a microscope. Bacteria are the microbes that most commonly cause UTIs. SYMPTOMS  Symptoms of UTIs may vary by age and gender of the patient and by the location of the infection. Symptoms in young women typically include a frequent and intense urge to urinate and a painful, burning feeling in the bladder or urethra during urination. Older women and men are more likely to be tired, shaky, and weak and have muscle aches and abdominal pain. A fever may mean the infection is in your kidneys. Other symptoms of a kidney infection include pain in your back or sides below the ribs, nausea, and vomiting. DIAGNOSIS To diagnose a UTI, your caregiver will ask you about your symptoms. Your caregiver also will ask to provide a urine sample. The urine sample will be tested for bacteria and white blood cells. White blood cells are made by your body to help fight infection. TREATMENT  Typically, UTIs can be treated with medication. Because most UTIs are caused by a bacterial infection, they usually can be treated with the use of antibiotics. The choice of antibiotic and  length of treatment depend on your symptoms and the type of bacteria causing your infection. HOME CARE INSTRUCTIONS  If you were prescribed antibiotics, take them exactly as your caregiver instructs you. Finish the medication even if you feel better after you have only taken some of the medication.  Drink enough water and fluids to keep your urine clear or pale yellow.  Avoid caffeine, tea, and carbonated beverages. They tend to irritate your bladder.  Empty your bladder often. Avoid holding urine for long periods of time.  Empty your bladder before and after sexual intercourse.  After a bowel movement, women should cleanse from front to back. Use each tissue only once. SEEK MEDICAL CARE IF:   You have back pain.  You develop a fever.  Your symptoms do not begin to resolve within 3 days. SEEK IMMEDIATE MEDICAL CARE IF:   You have severe back pain or lower abdominal pain.  You develop chills.  You have nausea or vomiting.  You have continued burning or discomfort with urination. MAKE SURE YOU:   Understand these instructions.  Will watch your condition.  Will get help right away if you are not doing well or get worse. Document Released: 09/19/2005 Document Revised: 06/10/2012 Document Reviewed: 01/18/2012 [facility] Patient Information 2014 [place].

## 2014-04-19 NOTE — Progress Notes (Signed)
   Subjective:    Patient ID: Carrie Stephens, female    DOB: [birth year], 80 y.o.   MRN: [id number]  Urinary Tract Infection  This is a new problem. The current episode started in the past 7 days (3d). The problem occurs intermittently. The problem has been waxing and waning. Quality: pt denies pain. She has dementia. Daughter states she hears her moaning when goes to bathroom. There has been no fever. She is not sexually active. Associated symptoms include frequency and hesitancy. Pertinent negatives include no chills, discharge, flank pain, hematuria, nausea, urgency or vomiting. Associated symptoms comments: daughter noticed increased confusion 3da, more quiet than usual yesterday. She has tried nothing for the symptoms.      Review of Systems  Constitutional: Negative for fever, chills, activity change, appetite change and fatigue.  Respiratory: Negative for cough.   Gastrointestinal: Negative for nausea and vomiting.  Genitourinary: Positive for dysuria, hesitancy, frequency and difficulty urinating. Negative for urgency, hematuria and flank pain.  Musculoskeletal: Negative for back pain.  Psychiatric/Behavioral: Positive for confusion.       Objective:   Physical Exam  Vitals reviewed. Constitutional: She appears well-developed and well-nourished. No distress.  HENT:  Head: Normocephalic and atraumatic.  Eyes: Conjunctivae are normal. Right eye exhibits no discharge. Left eye exhibits no discharge.  Cardiovascular: Normal rate, regular rhythm and normal heart sounds.   No murmur heard. Pulmonary/Chest: Effort normal and breath sounds normal. No respiratory distress. She has no wheezes. She has no rales.  Abdominal: Soft. She exhibits no distension and no mass. There is no tenderness. There is no rebound and no guarding.  Musculoskeletal: She exhibits no tenderness (no CVA tenderness).  Neurological: She is alert.  Skin: Skin is warm and dry.  Psychiatric:  Quiet, follows  instructions, looks to daughter to answer some questions.          Assessment & Plan:  1. Frequency Confusion per daughter - POCT urinalysis dipstick-blood, leuks - sulfamethoxazole-trimethoprim (BACTRIM) 400-80 MG per tablet; Take 1 tablet by mouth 2 (two) times daily.  Dispense: 6 tablet; Refill: 0 - Urine culture-pending F/u 2 wks

## 2014-04-21 ENCOUNTER — Telehealth: Payer: Self-pay | Admitting: Nurse Practitioner

## 2014-04-21 LAB — URINE CULTURE: Colony Count: >=100000

## 2014-04-21 NOTE — Telephone Encounter (Signed)
Left message for pt's daughter Neoma Laming to return call.

## 2014-04-21 NOTE — Telephone Encounter (Signed)
pls call daughter, ask if patient seems better.

## 2014-04-30 NOTE — Telephone Encounter (Signed)
LMOVM for daughter Hilda Blades to return call. 2nd message.

## 2014-05-06 ENCOUNTER — Encounter: Payer: Medicare Other | Admitting: Family Medicine

## 2014-05-14 ENCOUNTER — Ambulatory Visit: Payer: Medicare Other | Admitting: Family Medicine

## 2014-05-23 ENCOUNTER — Other Ambulatory Visit: Payer: Self-pay | Admitting: Family Medicine

## 2014-05-23 ENCOUNTER — Other Ambulatory Visit: Payer: Self-pay | Admitting: Family

## 2014-05-24 ENCOUNTER — Ambulatory Visit (INDEPENDENT_AMBULATORY_CARE_PROVIDER_SITE_OTHER): Payer: Medicare Other | Admitting: Family Medicine

## 2014-05-24 ENCOUNTER — Encounter: Payer: Self-pay | Admitting: Family Medicine

## 2014-05-24 VITALS — BP 98/58 | HR 58 | Temp 98.1°F | Ht 68.0 in | Wt 163.1 lb

## 2014-05-24 DIAGNOSIS — E039 Hypothyroidism, unspecified: Secondary | ICD-10-CM

## 2014-05-24 DIAGNOSIS — R739 Hyperglycemia, unspecified: Secondary | ICD-10-CM

## 2014-05-24 DIAGNOSIS — E785 Hyperlipidemia, unspecified: Secondary | ICD-10-CM

## 2014-05-24 DIAGNOSIS — M7989 Other specified soft tissue disorders: Secondary | ICD-10-CM

## 2014-05-24 DIAGNOSIS — R7309 Other abnormal glucose: Secondary | ICD-10-CM

## 2014-05-24 DIAGNOSIS — N390 Urinary tract infection, site not specified: Secondary | ICD-10-CM

## 2014-05-24 DIAGNOSIS — N289 Disorder of kidney and ureter, unspecified: Secondary | ICD-10-CM

## 2014-05-24 DIAGNOSIS — R319 Hematuria, unspecified: Secondary | ICD-10-CM

## 2014-05-24 NOTE — Progress Notes (Signed)
Pre visit review using our clinic review tool, if applicable. No additional management support is needed unless otherwise documented below in the visit note. 

## 2014-05-24 NOTE — Patient Instructions (Signed)
Consider a daily Probiotic such as Digestive Advantage, Phillip's Colon Health or a generic  Urinary Tract Infection Urinary tract infections (UTIs) can develop anywhere along your urinary tract. Your urinary tract is your body's drainage system for removing wastes and extra water. Your urinary tract includes two kidneys, two ureters, a bladder, and a urethra. Your kidneys are a pair of bean-shaped organs. Each kidney is about the size of your fist. They are located below your ribs, one on each side of your spine. CAUSES Infections are caused by microbes, which are microscopic organisms, including fungi, viruses, and bacteria. These organisms are so small that they can only be seen through a microscope. Bacteria are the microbes that most commonly cause UTIs. SYMPTOMS  Symptoms of UTIs may vary by age and gender of the patient and by the location of the infection. Symptoms in young women typically include a frequent and intense urge to urinate and a painful, burning feeling in the bladder or urethra during urination. Older women and men are more likely to be tired, shaky, and weak and have muscle aches and abdominal pain. A fever may mean the infection is in your kidneys. Other symptoms of a kidney infection include pain in your back or sides below the ribs, nausea, and vomiting. DIAGNOSIS To diagnose a UTI, your caregiver will ask you about your symptoms. Your caregiver also will ask to provide a urine sample. The urine sample will be tested for bacteria and white blood cells. White blood cells are made by your body to help fight infection. TREATMENT  Typically, UTIs can be treated with medication. Because most UTIs are caused by a bacterial infection, they usually can be treated with the use of antibiotics. The choice of antibiotic and length of treatment depend on your symptoms and the type of bacteria causing your infection. HOME CARE INSTRUCTIONS  If you were prescribed antibiotics, take them  exactly as your caregiver instructs you. Finish the medication even if you feel better after you have only taken some of the medication.  Drink enough water and fluids to keep your urine clear or pale yellow.  Avoid caffeine, tea, and carbonated beverages. They tend to irritate your bladder.  Empty your bladder often. Avoid holding urine for long periods of time.  Empty your bladder before and after sexual intercourse.  After a bowel movement, women should cleanse from front to back. Use each tissue only once. SEEK MEDICAL CARE IF:   You have back pain.  You develop a fever.  Your symptoms do not begin to resolve within 3 days. SEEK IMMEDIATE MEDICAL CARE IF:   You have severe back pain or lower abdominal pain.  You develop chills.  You have nausea or vomiting.  You have continued burning or discomfort with urination. MAKE SURE YOU:   Understand these instructions.  Will watch your condition.  Will get help right away if you are not doing well or get worse. Document Released: 09/19/2005 Document Revised: 06/10/2012 Document Reviewed: 01/18/2012 [facility] Patient Information 2014 [place].

## 2014-05-25 LAB — URINALYSIS
Bilirubin Urine: NEGATIVE
Glucose, UA: NEGATIVE mg/dL
Hgb urine dipstick: NEGATIVE
Ketones, ur: NEGATIVE mg/dL
Nitrite: NEGATIVE
Protein, ur: NEGATIVE mg/dL
SPECIFIC GRAVITY, URINE: 1.017 (ref 1.005–1.030)
UROBILINOGEN UA: 0.2 mg/dL (ref 0.0–1.0)
pH: 5 (ref 5.0–8.0)

## 2014-05-26 LAB — URINE CULTURE

## 2014-05-30 ENCOUNTER — Encounter: Payer: Self-pay | Admitting: Family Medicine

## 2014-05-30 NOTE — Assessment & Plan Note (Signed)
Stable, maintian hydration

## 2014-05-30 NOTE — Assessment & Plan Note (Signed)
Resolved with treatment of UTI, patient feeling better and confusion has resolved with treatment of UTI

## 2014-05-30 NOTE — Progress Notes (Signed)
Patient ID: Carrie Stephens, female   DOB: 09/16/1920, 80 y.o.   MRN: [id number] BELLAMY RUBEY [id number] [birth year] 05/30/2014      Progress Note-Follow Up  Subjective  Chief Complaint  No chief complaint on file.   HPI  Patient is a 80 year old female in today for routine medical care. Patient is in today with family for recheck on UTI. Is feeling much better. Confusion has resolved. The urine is no longer dark. Patient is denying any abdominal or back pain. No fevers or chills. No bloody urine. edema is improved. Denies CP/palp/SOB/HA/congestion/fevers/GI or GU c/o. Taking meds as prescribed  Past Medical History  Diagnosis Date  . S/P lumpectomy of breast 01/2006    and sentinel node dissection   . Radiation 04/2006    s/p external radiation -Arimidex since 04/12/2006  . Alzheimer disease     moderate to severe  . Hyperlipidemia   . Depression   . History of UTI   . Vision loss, left eye     secondary to retinal hemorrhage, unclear etiology  . Osteoporosis     s/p zometa  3.3 mg IV 05/2007  . Thyroid nodule   . 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 Renal insufficiency 11/15/2013  . Hyperglycemia 11/15/2013    Past Surgical History  Procedure Laterality Date  . Vaginal hysterectomy  1960's    status post  . Lobectomy  08/2009    thyroid right-mixed and macrofollicular adenoma, 8.5 cm, malignant features are not identified    Family History  Problem Relation Age of Onset  . Breast cancer Mother   . Diabetes Father     adult onset type 2  . Other Neg Hx     No report of heart disease or stroke in the family    History   Social History  . Marital Status: Widowed    Spouse Name: N/A    Number of Children: 3  . Years of Education: N/A   Occupational History  . Retired     [employer]   Social History Main Topics  .  Smoking status: Former Research scientist (life sciences)  . Smokeless tobacco: Never Used     Comment: light smoker- smoked 3 or 4 cigarettes a day for 55 years  . Alcohol Use: Not on file  . Drug Use: Not on file  . Sexual Activity: Not on file   Other Topics Concern  . 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  She has a total of 3 children, two daughters and one son.     No alcohol.  She quit tobacco approximately 2 years ago.  She   was a light smoker, smoked 3 or 4 cigarettes a day for 55 years.      Current Outpatient Prescriptions on File Prior to Visit  Medication Sig Dispense Refill  . aspirin 81 MG tablet Take 81 mg by mouth daily.        . calcium carbonate (OS-CAL) 600 MG TABS Take 600 mg by mouth 2 (two) times daily with a meal.        . Cholecalciferol (VITAMIN D3) 1000 UNITS tablet Take 1,000 Units by mouth daily.        Marland Kitchen  Cyanocobalamin 1000 MCG CAPS Take by mouth.      . donepezil (ARICEPT) 10 MG tablet TAKE 1 TABLET BY MOUTH EVERY DAY AT BEDTIME  30 tablet  5  . furosemide (LASIX) 20 MG tablet TAKE 1 TABLET DAILY  90 tablet  1  . levothyroxine (SYNTHROID, LEVOTHROID) 50 MCG tablet TAKE 1 TABLET (50 MCG TOTAL) BY MOUTH DAILY.  90 tablet  1  . mirtazapine (REMERON) 15 MG tablet TAKE 1 TABLET (15 MG TOTAL) BY MOUTH AT BEDTIME.  90 tablet  1  . NAMENDA XR 28 MG CP24 TAKE ONE CAPSULE BY MOUTH DAILY  30 capsule  3  . potassium chloride (KLOR-CON M10) 10 MEQ tablet TAKE 1 TABLET BY MOUTH EVERY DAY  90 tablet  1  . simvastatin (ZOCOR) 5 MG tablet TAKE 1 TABLET (5 MG TOTAL) BY MOUTH AT BEDTIME.  30 tablet  2   No current facility-administered medications on file prior to visit.    Allergies  Allergen Reactions  . Codeine Phosphate     REACTION: unspecified  . Ibuprofen     REACTION: unspecified  . Moxifloxacin     REACTION: rash    Review of Systems  Review of Systems  Constitutional: Negative for  fever and malaise/fatigue.  HENT: Negative for congestion.   Eyes: Negative for discharge.  Respiratory: Negative for shortness of breath.   Cardiovascular: Negative for chest pain, palpitations and leg swelling.  Gastrointestinal: Negative for nausea, abdominal pain and diarrhea.  Genitourinary: Negative for dysuria.  Musculoskeletal: Negative for falls.  Skin: Negative for rash.  Neurological: Negative for loss of consciousness and headaches.  Endo/Heme/Allergies: Negative for polydipsia.  Psychiatric/Behavioral: Negative for depression and suicidal ideas. The patient is not nervous/anxious and does not have insomnia.     Objective  BP 98/58  Pulse 58  Temp(Src) 98.1 F (36.7 C) (Oral)  Ht 5\' 8"  (1.727 m)  Wt 163 lb 1.3 oz (73.973 kg)  BMI 24.80 kg/m2  SpO2 97%  Physical Exam  Physical Exam  Constitutional: She is oriented to person, place, and time and well-developed, well-nourished, and in no distress. No distress.  HENT:  Head: Normocephalic and atraumatic.  Eyes: Conjunctivae are normal.  Neck: Neck supple. No thyromegaly present.  Cardiovascular: Normal rate, regular rhythm and normal heart sounds.   No murmur heard. Pulmonary/Chest: Effort normal and breath sounds normal. She has no wheezes.  Abdominal: She exhibits no distension and no mass.  Musculoskeletal: She exhibits no edema.  Lymphadenopathy:    She has no cervical adenopathy.  Neurological: She is alert and oriented to person, place, and time.  Skin: Skin is warm and dry. No rash noted. She is not diaphoretic.  Psychiatric: Memory, affect and judgment normal.    Lab Results  Component Value Date   TSH 1.326 09/04/2012   Lab Results  Component Value Date   WBC 4.1 04/13/2013   HGB 13.9 04/13/2013   HCT 42.2 04/13/2013   MCV 92.4 04/13/2013   PLT 247 04/13/2013   Lab Results  Component Value Date   CREATININE 1.4* 04/13/2013   BUN 16.6 04/13/2013   NA 147* 04/13/2013   K 3.9 04/13/2013   CL 110*  04/13/2013   CO2 24 04/13/2013   Lab Results  Component Value Date   ALT 10 04/13/2013   AST 20 04/13/2013   ALKPHOS 56 04/13/2013   BILITOT 0.76 04/13/2013   Lab Results  Component Value Date   CHOL 156 09/04/2012   Lab Results  Component Value Date   HDL 57 09/04/2012   Lab Results  Component Value Date   LDLCALC 43 09/04/2012   Lab Results  Component Value Date   TRIG 279* 09/04/2012   Lab Results  Component Value Date   CHOLHDL 2.7 09/04/2012     Assessment & Plan  HYPOTHYROIDISM On Levothyroxine, continue to monitor  Hematuria Resolved with treatment of UTI, patient feeling better and confusion has resolved with treatment of UTI  HYPERLIPIDEMIA Tolerating statin, encouraged heart healthy diet, avoid trans fats, minimize simple carbs and saturated fats. Increase exercise as tolerated  Renal insufficiency Stable, maintian hydration  Leg swelling Improved, continue Furosemide

## 2014-05-30 NOTE — Assessment & Plan Note (Signed)
On Levothyroxine, continue to monitor 

## 2014-05-30 NOTE — Assessment & Plan Note (Signed)
Tolerating statin, encouraged heart healthy diet, avoid trans fats, minimize simple carbs and saturated fats. Increase exercise as tolerated 

## 2014-05-30 NOTE — Assessment & Plan Note (Signed)
Improved, continue Furosemide.  

## 2014-06-03 ENCOUNTER — Other Ambulatory Visit (INDEPENDENT_AMBULATORY_CARE_PROVIDER_SITE_OTHER): Payer: Medicare Other

## 2014-06-03 DIAGNOSIS — Z0000 Encounter for general adult medical examination without abnormal findings: Secondary | ICD-10-CM

## 2014-06-03 DIAGNOSIS — E785 Hyperlipidemia, unspecified: Secondary | ICD-10-CM

## 2014-06-03 LAB — TSH: TSH: 1.23 u[IU]/mL (ref 0.35–4.50)

## 2014-06-03 LAB — LIPID PANEL
CHOLESTEROL: 160 mg/dL (ref 0–200)
HDL: 60.8 mg/dL (ref >39.00)
LDL CALC: 59 mg/dL (ref 0–99)
NonHDL: 99.2
Total CHOL/HDL Ratio: 3
Triglycerides: 202 mg/dL — ABNORMAL HIGH (ref 0.0–149.0)
VLDL: 40.4 mg/dL — ABNORMAL HIGH (ref 0.0–40.0)

## 2014-06-03 LAB — RENAL FUNCTION PANEL
Albumin: 3.4 g/dL — ABNORMAL LOW (ref 3.5–5.2)
BUN: 14 mg/dL (ref 6–23)
CO2: 28 meq/L (ref 19–32)
CREATININE: 1.3 mg/dL — AB (ref 0.4–1.2)
Calcium: 9 mg/dL (ref 8.4–10.5)
Chloride: 107 mEq/L (ref 96–112)
GFR: 51.04 mL/min — AB (ref >60.00)
Glucose, Bld: 132 mg/dL — ABNORMAL HIGH (ref 70–99)
Phosphorus: 3.9 mg/dL (ref 2.3–4.6)
Potassium: 3.8 mEq/L (ref 3.5–5.1)
Sodium: 144 mEq/L (ref 135–145)

## 2014-06-03 LAB — HEPATIC FUNCTION PANEL
ALBUMIN: 3.4 g/dL — AB (ref 3.5–5.2)
ALT: 10 U/L (ref 0–35)
AST: 21 U/L (ref 0–37)
Alkaline Phosphatase: 46 U/L (ref 39–117)
Bilirubin, Direct: 0.1 mg/dL (ref 0.0–0.3)
TOTAL PROTEIN: 6.2 g/dL (ref 6.0–8.3)
Total Bilirubin: 0.7 mg/dL (ref 0.2–1.2)

## 2014-06-03 LAB — CBC
HEMATOCRIT: 38.6 % (ref 36.0–46.0)
Hemoglobin: 12.9 g/dL (ref 12.0–15.0)
MCHC: 33.4 g/dL (ref 30.0–36.0)
MCV: 92.6 fl (ref 78.0–100.0)
Platelets: 253 10*3/uL (ref 150.0–400.0)
RBC: 4.17 Mil/uL (ref 3.87–5.11)
RDW: 13.9 % (ref 11.5–15.5)
WBC: 6.1 10*3/uL (ref 4.0–10.5)

## 2014-06-03 LAB — T4, FREE: Free T4: 0.91 ng/dL (ref 0.60–1.60)

## 2014-06-28 ENCOUNTER — Other Ambulatory Visit: Payer: Self-pay | Admitting: Family Medicine

## 2014-06-29 ENCOUNTER — Other Ambulatory Visit: Payer: Self-pay | Admitting: Family Medicine

## 2014-08-31 ENCOUNTER — Other Ambulatory Visit: Payer: Self-pay

## 2014-08-31 MED ORDER — DONEPEZIL HCL 10 MG PO TABS: 10.0000 mg | ORAL_TABLET | Freq: Every day | ORAL | Status: DC

## 2014-08-31 MED ORDER — SIMVASTATIN 5 MG PO TABS: 5.0000 mg | ORAL_TABLET | Freq: Every day | ORAL | Status: DC

## 2014-09-22 ENCOUNTER — Ambulatory Visit (INDEPENDENT_AMBULATORY_CARE_PROVIDER_SITE_OTHER): Payer: Medicare Other | Admitting: Physician Assistant

## 2014-09-22 ENCOUNTER — Encounter: Payer: Self-pay | Admitting: Physician Assistant

## 2014-09-22 VITALS — BP 96/57 | HR 50 | Temp 98.8°F | Resp 14 | Ht 68.0 in | Wt 168.5 lb

## 2014-09-22 DIAGNOSIS — K1370 Unspecified lesions of oral mucosa: Secondary | ICD-10-CM | POA: Insufficient documentation

## 2014-09-22 DIAGNOSIS — K625 Hemorrhage of anus and rectum: Secondary | ICD-10-CM

## 2014-09-22 DIAGNOSIS — Z87440 Personal history of urinary (tract) infections: Secondary | ICD-10-CM

## 2014-09-22 LAB — URINALYSIS, MICROSCOPIC ONLY

## 2014-09-22 LAB — POCT URINALYSIS DIPSTICK
BILIRUBIN UA: NEGATIVE
Blood, UA: NEGATIVE
GLUCOSE UA: NEGATIVE
KETONES UA: NEGATIVE
Leukocytes, UA: NEGATIVE
NITRITE UA: NEGATIVE
PH UA: 5
Protein, UA: NEGATIVE
Spec Grav, UA: 1.015
Urobilinogen, UA: 0.2

## 2014-09-22 LAB — CBC
HEMATOCRIT: 36.5 % (ref 36.0–46.0)
Hemoglobin: 12.2 g/dL (ref 12.0–15.0)
MCHC: 33.3 g/dL (ref 30.0–36.0)
MCV: 93.3 fl (ref 78.0–100.0)
PLATELETS: 253 10*3/uL (ref 150.0–400.0)
RBC: 3.91 Mil/uL (ref 3.87–5.11)
RDW: 14.1 % (ref 11.5–15.5)
WBC: 5.1 10*3/uL (ref 4.0–10.5)

## 2014-09-22 MED ORDER — MUPIROCIN 2 % EX OINT: 1.0000 "application " | TOPICAL_OINTMENT | Freq: Two times a day (BID) | CUTANEOUS | Status: DC

## 2014-09-22 NOTE — Progress Notes (Signed)
Pre visit review using our clinic review tool, if applicable. No additional management support is needed unless otherwise documented below in the visit note/SLS  

## 2014-09-22 NOTE — Assessment & Plan Note (Signed)
One episode.  Seems consistent with mild anal fissure. Seems to have resolved on its own.  Will check CBC and IFOB. Encouraged increase fluids and stool softener for hard stools.

## 2014-09-22 NOTE — Assessment & Plan Note (Signed)
Rx Bactroban topically BID x 2 weeks.

## 2014-09-22 NOTE — Patient Instructions (Signed)
I do not see any signs of infection or blood in the urine. I will send the urine for microscopic analysis and culture to verify this.  Symptoms sound consistent with a small tear in the anus that has healed.   I am checking a blood count to make sure there is no sign of anemia.  Please use IFOB to obtain stool specimen to mail into the lab. I will call you with all results. Monitor stools for recurrence.  If this happens, let me know.  For the place on the mouth, apply the Bactroban ointment twice daily for 2 weeks.

## 2014-09-22 NOTE — Progress Notes (Signed)
Patient with significant Alzheimer's presents to clinic today with her daughter c/o episode of bright red blood on the toilet paper and in the toilet bowel.  History is somewhat limited as patient cannot remember the events that transpired.  Daughter denies noting vaginal bleeding.  Denies patient with increase urination or incontinence.  Denies increased confusion or fever.  Endorses occasional hard stool.  Past Medical History  Diagnosis Date  . S/P lumpectomy of breast 01/2006    and sentinel node dissection   . Radiation 04/2006    s/p external radiation -Arimidex since 04/12/2006  . Alzheimer disease     moderate to severe  . Hyperlipidemia   . Depression   . History of UTI   . Vision loss, left eye     secondary to retinal hemorrhage, unclear etiology  . Osteoporosis     s/p zometa  3.3 mg IV 05/2007  . Thyroid nodule   . 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 Renal insufficiency 11/15/2013  . Hyperglycemia 11/15/2013    Current Outpatient Prescriptions on File Prior to Visit  Medication Sig Dispense Refill  . aspirin 81 MG tablet Take 81 mg by mouth daily.        . calcium carbonate (OS-CAL) 600 MG TABS Take 600 mg by mouth 2 (two) times daily with a meal.        . Cholecalciferol (VITAMIN D3) 1000 UNITS tablet Take 1,000 Units by mouth daily.        . Cyanocobalamin 1000 MCG CAPS Take by mouth.      . donepezil (ARICEPT) 10 MG tablet Take 1 tablet (10 mg total) by mouth at bedtime.  90 tablet  0  . furosemide (LASIX) 20 MG tablet TAKE 1 TABLET DAILY  90 tablet  1  . levothyroxine (SYNTHROID, LEVOTHROID) 50 MCG tablet TAKE 1 TABLET (50 MCG TOTAL) BY MOUTH DAILY.  90 tablet  1  . mirtazapine (REMERON) 15 MG tablet TAKE 1 TABLET (15 MG TOTAL) BY MOUTH AT BEDTIME.  90 tablet  1  . NAMENDA XR 28 MG CP24 TAKE ONE CAPSULE BY MOUTH DAILY  30  capsule  3  . potassium chloride (KLOR-CON M10) 10 MEQ tablet TAKE 1 TABLET BY MOUTH EVERY DAY  90 tablet  1  . simvastatin (ZOCOR) 5 MG tablet Take 1 tablet (5 mg total) by mouth at bedtime.  30 tablet  2   No current facility-administered medications on file prior to visit.    Allergies  Allergen Reactions  . Codeine Phosphate     REACTION: unspecified  . Ibuprofen     REACTION: unspecified  . Moxifloxacin     REACTION: rash    Family History  Problem Relation Age of Onset  . Breast cancer Mother   . Diabetes Father     adult onset type 2  . Other Neg Hx     No report of heart disease or stroke in the family    History   Social History  . Marital Status: Widowed    Spouse Name: N/A    Number of Children: 3  . Years of Education: N/A   Occupational History  . Retired     [employer]   Social History Main Topics  . Smoking status: Former Research scientist (life sciences)  . Smokeless tobacco: Never Used  Comment: light smoker- smoked 3 or 4 cigarettes a day for 55 years  . Alcohol Use: None  . Drug Use: None  . Sexual Activity: None   Other Topics Concern  . None   Social History Narrative   Patient is widowed, is the retired Public relations account executive   currently lives with her daughter.  She has a total of 3 children, two daughters and one son.     No alcohol.  She quit tobacco approximately 2 years ago.  She   was a light smoker, smoked 3 or 4 cigarettes a day for 55 years.      Review of Systems - See HPI.  All other ROS are negative.  BP 96/57  Pulse 50  Temp(Src) 98.8 F (37.1 C) (Oral)  Resp 14  Ht 5\' 8"  (1.727 m)  Wt 168 lb 8 oz (76.431 kg)  BMI 25.63 kg/m2  SpO2 98%  Physical Exam  Vitals reviewed. Constitutional: She is well-developed, well-nourished, and in no distress.  HENT:  Head: Normocephalic and atraumatic.  Eyes: Conjunctivae are normal.  Cardiovascular: Normal rate, regular rhythm and normal heart sounds.   Pulmonary/Chest: Effort normal and  breath sounds normal. No respiratory distress.  Neurological: She is alert.  Skin: Skin is warm and dry. No rash noted.  Psychiatric: Affect normal.    Recent Results (from the past 2160 hour(s))  CBC     Status: None   Collection Time    09/22/14  2:10 PM      Result Value Ref Range   WBC 5.1  4.0 - 10.5 K/uL   RBC 3.91  3.87 - 5.11 Mil/uL   Platelets 253.0  150.0 - 400.0 K/uL   Hemoglobin 12.2  12.0 - 15.0 g/dL   HCT 36.5  36.0 - 46.0 %   MCV 93.3  78.0 - 100.0 fl   MCHC 33.3  30.0 - 36.0 g/dL   RDW 14.1  11.5 - 15.5 %  URINALYSIS, MICROSCOPIC ONLY     Status: Abnormal   Collection Time    09/22/14  2:10 PM      Result Value Ref Range   WBC, UA 0-2/hpf  0-2/hpf   RBC / HPF 0-2/hpf  0-2/hpf   Squamous Epithelial / LPF Rare(0-4/hpf)  Rare(0-4/hpf)   Hyaline Casts, UA Presence of (*) None  POCT URINALYSIS DIPSTICK     Status: None   Collection Time    09/22/14  3:25 PM      Result Value Ref Range   Color, UA straw     Clarity, UA clear     Glucose, UA neg     Bilirubin, UA neg     Ketones, UA neg     Spec Grav, UA 1.015     Blood, UA neg     pH, UA 5.0     Protein, UA neg     Urobilinogen, UA 0.2     Nitrite, UA neg     Leukocytes, UA Negative      Assessment/Plan: Bright red blood per rectum One episode.  Seems consistent with mild anal fissure. Seems to have resolved on its own.  Will check CBC and IFOB. Encouraged increase fluids and stool softener for hard stools.  Lesion of mouth Rx Bactroban topically BID x 2 weeks.

## 2014-09-23 LAB — URINE CULTURE: Colony Count: 8000

## 2014-09-27 ENCOUNTER — Telehealth: Payer: Self-pay | Admitting: *Deleted

## 2014-09-27 DIAGNOSIS — R82998 Other abnormal findings in urine: Secondary | ICD-10-CM

## 2014-09-27 NOTE — Telephone Encounter (Signed)
Message copied by Rockwell Germany on Mon Sep 27, 2014  1:31 PM ------      Message from: Raiford Noble      Created: Thu Sep 23, 2014 12:21 AM       No sign of anemia.  Urine looks good except for presence of casts which are not typically found in urine.  This could be a non-worrisome finding, but I would like for patient to come to lab next week just for a repeat urinalysis. ------

## 2014-09-27 NOTE — Telephone Encounter (Signed)
Patient Unavailable; daughter informed, understood & agreed; will take patient to [facility] lab [new lab order placed]/SLS

## 2014-09-28 ENCOUNTER — Other Ambulatory Visit: Payer: Medicare Other

## 2014-09-28 DIAGNOSIS — R82998 Other abnormal findings in urine: Secondary | ICD-10-CM

## 2014-10-01 ENCOUNTER — Telehealth: Payer: Self-pay

## 2014-10-01 NOTE — Telephone Encounter (Signed)
Thank you for making me aware.  If symptoms worsen, needs to proceed to ER.

## 2014-10-01 NOTE — Telephone Encounter (Signed)
Caller [pt's daughter was informed during phone call that if constant, heavy and/or bright red blood were to start outside of the BM, dizziness, nausea/vomiting, chest/abdominal pain, SOB to procedd to Emergent facility for A&E immediately. Caller understood & agreed/SLS

## 2014-10-01 NOTE — Telephone Encounter (Signed)
Verna Czech Daughter [phone] work   Neoma Laming called to say, that Darl had another bowel movement last night with a lot of bleeding. She is wondering if she needs to bring her back in, or what should she do.

## 2014-10-01 NOTE — Telephone Encounter (Signed)
FYI: Spoke with caller [pt's daughter] and she reports that she did get the IFOB sample that was ordered at the 09.30.15 OV finally and mailed to lab yesterday; stated that patient has CPE scheduled for Mon, 10.19.15 with PCP and will just wait until then for f/u on this matter, as to not have to make [2] trips within one week/SLS

## 2014-10-05 ENCOUNTER — Other Ambulatory Visit: Payer: Medicare Other

## 2014-10-06 ENCOUNTER — Other Ambulatory Visit: Payer: Medicare Other

## 2014-10-06 ENCOUNTER — Telehealth: Payer: Self-pay | Admitting: Family Medicine

## 2014-10-06 DIAGNOSIS — K625 Hemorrhage of anus and rectum: Secondary | ICD-10-CM

## 2014-10-06 LAB — FECAL OCCULT BLOOD, IMMUNOCHEMICAL: Fecal Occult Bld: NEGATIVE

## 2014-10-06 NOTE — Telephone Encounter (Signed)
Caller name:Linette  Relation to QL:[facility]  Call back number: Pharmacy:  Reason for call: Needing order place IFOB for pt , needs by lunch time

## 2014-10-06 NOTE — Telephone Encounter (Signed)
New order placed, [facility] informed/SLS

## 2014-10-11 ENCOUNTER — Ambulatory Visit (INDEPENDENT_AMBULATORY_CARE_PROVIDER_SITE_OTHER): Payer: Medicare Other | Admitting: Family Medicine

## 2014-10-11 ENCOUNTER — Encounter: Payer: Self-pay | Admitting: Family Medicine

## 2014-10-11 VITALS — BP 100/64 | HR 61 | Temp 98.4°F | Ht 68.0 in | Wt 166.4 lb

## 2014-10-11 DIAGNOSIS — E039 Hypothyroidism, unspecified: Secondary | ICD-10-CM

## 2014-10-11 DIAGNOSIS — E785 Hyperlipidemia, unspecified: Secondary | ICD-10-CM

## 2014-10-11 DIAGNOSIS — G309 Alzheimer's disease, unspecified: Secondary | ICD-10-CM

## 2014-10-11 DIAGNOSIS — N289 Disorder of kidney and ureter, unspecified: Secondary | ICD-10-CM

## 2014-10-11 DIAGNOSIS — K625 Hemorrhage of anus and rectum: Secondary | ICD-10-CM

## 2014-10-11 DIAGNOSIS — F0280 Dementia in other diseases classified elsewhere without behavioral disturbance: Secondary | ICD-10-CM

## 2014-10-11 DIAGNOSIS — Z0000 Encounter for general adult medical examination without abnormal findings: Secondary | ICD-10-CM

## 2014-10-11 MED ORDER — HYDROCORTISONE ACETATE 25 MG RE SUPP: 25.0000 mg | Freq: Every evening | RECTAL | Status: DC | PRN

## 2014-10-11 NOTE — Progress Notes (Signed)
Patient ID: LISSETE MAESTAS, female   DOB: 1920/05/15, 80 y.o.   MRN: [id number] LAKEISA HENINGER [id number] 24-Jan-1920 10/11/2014      Progress Note-Follow Up  Subjective  Chief Complaint  Chief Complaint  Patient presents with  . Annual Exam    physical    HPI  Patient is a 80 year old female in today for routine medical care. Here today with her daughter. She is doing very well at home with her family. Her dementia hampers her daily activities but she is still able to assist with many of her ADLs, as long as they tell her what to wear and to get dressed she can do it herself. Etc. No recent hospitalizations or illness. Had a small episode of rectal bleeding last week and has had this in the past, daughter thinks last episode was 2013 and work up was negative. No other complaints. No c/o straining. No anorexia, n/v/diarrhea. Denies CP/palp/SOB/HA/congestion/fevers/GI or GU c/o. Taking meds as prescribed  Past Medical History  Diagnosis Date  . S/P lumpectomy of breast 01/2006    and sentinel node dissection   . Radiation 04/2006    s/p external radiation -Arimidex since 04/12/2006  . Alzheimer disease     moderate to severe  . Hyperlipidemia   . Depression   . History of UTI   . Vision loss, left eye     secondary to retinal hemorrhage, unclear etiology  . Osteoporosis     s/p zometa  3.3 mg IV 05/2007  . Thyroid nodule   . 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 Renal insufficiency 11/15/2013  . Hyperglycemia 11/15/2013    Past Surgical History  Procedure Laterality Date  . Vaginal hysterectomy  1960's    status post  . Lobectomy  08/2009    thyroid right-mixed and macrofollicular adenoma, 8.5 cm, malignant features are not identified    Family History  Problem Relation Age of Onset  . Breast cancer Mother   . Diabetes Father     adult  onset type 2  . Other Neg Hx     No report of heart disease or stroke in the family    History   Social History  . Marital Status: Widowed    Spouse Name: N/A    Number of Children: 3  . Years of Education: N/A   Occupational History  . Retired     [employer]   Social History Main Topics  . Smoking status: Former Research scientist (life sciences)  . Smokeless tobacco: Never Used     Comment: light smoker- smoked 3 or 4 cigarettes a day for 55 years  . Alcohol Use: Not on file  . Drug Use: Not on file  . Sexual Activity: Not on file   Other Topics Concern  . 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  She has a total of 3 children, two daughters and one son.     No alcohol.  She quit tobacco approximately 2 years ago.  She   was a light smoker, smoked 3 or 4 cigarettes a day for 55 years.      Current Outpatient Prescriptions on File Prior to Visit  Medication Sig Dispense Refill  . aspirin 81  MG tablet Take 81 mg by mouth daily.        . calcium carbonate (OS-CAL) 600 MG TABS Take 600 mg by mouth 2 (two) times daily with a meal.        . Cholecalciferol (VITAMIN D3) 1000 UNITS tablet Take 1,000 Units by mouth daily.        . Cyanocobalamin 1000 MCG CAPS Take by mouth.      . donepezil (ARICEPT) 10 MG tablet Take 1 tablet (10 mg total) by mouth at bedtime.  90 tablet  0  . furosemide (LASIX) 20 MG tablet TAKE 1 TABLET DAILY  90 tablet  1  . levothyroxine (SYNTHROID, LEVOTHROID) 50 MCG tablet TAKE 1 TABLET (50 MCG TOTAL) BY MOUTH DAILY.  90 tablet  1  . mirtazapine (REMERON) 15 MG tablet TAKE 1 TABLET (15 MG TOTAL) BY MOUTH AT BEDTIME.  90 tablet  1  . mupirocin ointment (BACTROBAN) 2 % Place 1 application into the nose 2 (two) times daily.  22 g  0  . NAMENDA XR 28 MG CP24 TAKE ONE CAPSULE BY MOUTH DAILY  30 capsule  3  . potassium chloride (KLOR-CON M10) 10 MEQ tablet TAKE 1 TABLET BY MOUTH EVERY  DAY  90 tablet  1  . simvastatin (ZOCOR) 5 MG tablet Take 1 tablet (5 mg total) by mouth at bedtime.  30 tablet  2   No current facility-administered medications on file prior to visit.    Allergies  Allergen Reactions  . Codeine Phosphate     REACTION: unspecified  . Ibuprofen     REACTION: unspecified  . Moxifloxacin     REACTION: rash    Review of Systems  Review of Systems  Constitutional: Negative for fever, chills and malaise/fatigue.  HENT: Negative for congestion, hearing loss and nosebleeds.   Eyes: Negative for discharge.  Respiratory: Negative for cough, sputum production, shortness of breath and wheezing.   Cardiovascular: Negative for chest pain, palpitations and leg swelling.  Gastrointestinal: Negative for heartburn, nausea, vomiting, abdominal pain, diarrhea, constipation and blood in stool.  Genitourinary: Negative for dysuria, urgency, frequency and hematuria.  Musculoskeletal: Negative for back pain, falls and myalgias.  Skin: Negative for rash.  Neurological: Negative for dizziness, tremors, sensory change, focal weakness, loss of consciousness, weakness and headaches.  Endo/Heme/Allergies: Negative for polydipsia. Does not bruise/bleed easily.  Psychiatric/Behavioral: Negative for depression and suicidal ideas. The patient is not nervous/anxious and does not have insomnia.     Objective  BP 66/50  Pulse 61  Temp(Src) 98.4 F (36.9 C) (Oral)  Ht 5\' 8"  (1.727 m)  Wt 166 lb 6.4 oz (75.479 kg)  BMI 25.31 kg/m2  SpO2 99%  Physical Exam  Physical Exam  Constitutional: She is oriented to person, place, and time and well-developed, well-nourished, and in no distress. No distress.  HENT:  Head: Normocephalic and atraumatic.  Right Ear: External ear normal.  Left Ear: External ear normal.  Nose: Nose normal.  Mouth/Throat: Oropharynx is clear and moist. No oropharyngeal exudate.  Eyes: Conjunctivae are normal. Pupils are equal, round, and reactive to  light. Right eye exhibits no discharge. Left eye exhibits no discharge. No scleral icterus.  Neck: Normal range of motion. Neck supple. No thyromegaly present.  Cardiovascular: Normal rate, regular rhythm, normal heart sounds and intact distal pulses.   No murmur heard. Pulmonary/Chest: Effort normal and breath sounds normal. No respiratory distress. She has no wheezes. She has no rales.  Abdominal: Soft. Bowel sounds are normal.  She exhibits no distension and no mass. There is no tenderness.  Musculoskeletal: Normal range of motion. She exhibits no edema and no tenderness.  Lymphadenopathy:    She has no cervical adenopathy.  Neurological: She is alert and oriented to person, place, and time. She has normal reflexes. No cranial nerve deficit. Coordination normal.  Skin: Skin is warm and dry. No rash noted. She is not diaphoretic.  Psychiatric: Mood, memory and affect normal.    Lab Results  Component Value Date   TSH 1.23 06/03/2014   Lab Results  Component Value Date   WBC 5.1 09/22/2014   HGB 12.2 09/22/2014   HCT 36.5 09/22/2014   MCV 93.3 09/22/2014   PLT 253.0 09/22/2014   Lab Results  Component Value Date   CREATININE 1.3* 06/03/2014   BUN 14 06/03/2014   NA 144 06/03/2014   K 3.8 06/03/2014   CL 107 06/03/2014   CO2 28 06/03/2014   Lab Results  Component Value Date   ALT 10 06/03/2014   AST 21 06/03/2014   ALKPHOS 46 06/03/2014   BILITOT 0.7 06/03/2014   Lab Results  Component Value Date   CHOL 160 06/03/2014   Lab Results  Component Value Date   HDL 60.80 06/03/2014   Lab Results  Component Value Date   LDLCALC 59 06/03/2014   Lab Results  Component Value Date   TRIG 202.0* 06/03/2014   Lab Results  Component Value Date   CHOLHDL 3 06/03/2014     Assessment & Plan   Medicare annual wellness visit, subsequent Patient denies any difficulties at home. No trouble with ADLs, depression or falls. No recent changes to vision or hearing. Is UTD with immunizations. Is  UTD with screening. Discussed Advanced Directives, patient agrees to bring [place] copies of documents if can. Encouraged heart healthy diet, exercise as tolerated and adequate sleep. Declines flu shot today receiving elsewhere. No longer participates in colonoscopies, MGMs, paps, bone densities. Labs reviewed. Follows with opthamology and dentist sees Dr Sabino Gasser.   Hypothyroidism On Levothyroxine, continue to monitor  Hyperlipidemia Tolerating statin, encouraged heart healthy diet, avoid trans fats, minimize simple carbs and saturated fats. Increase exercise as tolerated. Increase simvastatin today  Bright red blood per rectum Infrequent and a small amount. Likely hemorrhoids, encouraged probiotics, fiber supplement, 64 oz of clear fluids and consider anusol HC suppositories prn  ALZHEIMER'S DISEASE Doing well at home with family on current meds. No behavioral concerns.  Renal insufficiency Mild, stable, maintain adequate hydration

## 2014-10-11 NOTE — Patient Instructions (Addendum)
Consider a daily probiotic such as Digestive Advantage or Phillip's Colon Health Consider fiber, such as Benefiber or Metamucil/Psyllium  Hemorrhoids Hemorrhoids are swollen veins around the rectum or anus. There are two types of hemorrhoids:   Internal hemorrhoids. These occur in the veins just inside the rectum. They may poke through to the outside and become irritated and painful.  External hemorrhoids. These occur in the veins outside the anus and can be felt as a painful swelling or hard lump near the anus. CAUSES  Pregnancy.   Obesity.   Constipation or diarrhea.   Straining to have a bowel movement.   Sitting for long periods on the toilet.  Heavy lifting or other activity that caused you to strain.  Anal intercourse. SYMPTOMS   Pain.   Anal itching or irritation.   Rectal bleeding.   Fecal leakage.   Anal swelling.   One or more lumps around the anus.  DIAGNOSIS  Your caregiver may be able to diagnose hemorrhoids by visual examination. Other examinations or tests that may be performed include:   Examination of the rectal area with a gloved hand (digital rectal exam).   Examination of anal canal using a small tube (scope).   A blood test if you have lost a significant amount of blood.  A test to look inside the colon (sigmoidoscopy or colonoscopy). TREATMENT Most hemorrhoids can be treated at home. However, if symptoms do not seem to be getting better or if you have a lot of rectal bleeding, your caregiver may perform a procedure to help make the hemorrhoids get smaller or remove them completely. Possible treatments include:   Placing a rubber band at the base of the hemorrhoid to cut off the circulation (rubber band ligation).   Injecting a chemical to shrink the hemorrhoid (sclerotherapy).   Using a tool to burn the hemorrhoid (infrared light therapy).   Surgically removing the hemorrhoid (hemorrhoidectomy).   Stapling the hemorrhoid  to block blood flow to the tissue (hemorrhoid stapling).  HOME CARE INSTRUCTIONS   Eat foods with fiber, such as whole grains, beans, nuts, fruits, and vegetables. Ask your doctor about taking products with added fiber in them (fibersupplements).  Increase fluid intake. Drink enough water and fluids to keep your urine clear or pale yellow.   Exercise regularly.   Go to the bathroom when you have the urge to have a bowel movement. Do not wait.   Avoid straining to have bowel movements.   Keep the anal area dry and clean. Use wet toilet paper or moist towelettes after a bowel movement.   Medicated creams and suppositories may be used or applied as directed.   Only take over-the-counter or prescription medicines as directed by your caregiver.   Take warm sitz baths for 15-20 minutes, 3-4 times a day to ease pain and discomfort.   Place ice packs on the hemorrhoids if they are tender and swollen. Using ice packs between sitz baths may be helpful.   Put ice in a plastic bag.   Place a towel between your skin and the bag.   Leave the ice on for 15-20 minutes, 3-4 times a day.   Do not use a donut-shaped pillow or sit on the toilet for long periods. This increases blood pooling and pain.  SEEK MEDICAL CARE IF:  You have increasing pain and swelling that is not controlled by treatment or medicine.  You have uncontrolled bleeding.  You have difficulty or you are unable to have a bowel  movement.  You have pain or inflammation outside the area of the hemorrhoids. MAKE SURE YOU:  Understand these instructions.  Will watch your condition.  Will get help right away if you are not doing well or get worse. Document Released: 12/07/2000 Document Revised: 11/26/2012 Document Reviewed: 10/14/2012 [facility] Patient Information 2015 [place], [place]. This information is not intended to replace advice given to you by your health care provider. Make sure you discuss any questions  you have with your health care provider.

## 2014-10-11 NOTE — Progress Notes (Signed)
Pre visit review using our clinic review tool, if applicable. No additional management support is needed unless otherwise documented below in the visit note. 

## 2014-10-12 LAB — LIPID PANEL
CHOL/HDL RATIO: 3
Cholesterol: 209 mg/dL — ABNORMAL HIGH (ref 0–200)
HDL: 67 mg/dL (ref >39.00)
NONHDL: 142
TRIGLYCERIDES: 258 mg/dL — AB (ref 0.0–149.0)
VLDL: 51.6 mg/dL — ABNORMAL HIGH (ref 0.0–40.0)

## 2014-10-12 LAB — CBC
HCT: 42.8 % (ref 36.0–46.0)
Hemoglobin: 14 g/dL (ref 12.0–15.0)
MCHC: 32.6 g/dL (ref 30.0–36.0)
MCV: 92.8 fl (ref 78.0–100.0)
PLATELETS: 317 10*3/uL (ref 150.0–400.0)
RBC: 4.62 Mil/uL (ref 3.87–5.11)
RDW: 14.2 % (ref 11.5–15.5)
WBC: 6.6 10*3/uL (ref 4.0–10.5)

## 2014-10-12 LAB — HEPATIC FUNCTION PANEL
ALBUMIN: 3.7 g/dL (ref 3.5–5.2)
ALT: 15 U/L (ref 0–35)
AST: 27 U/L (ref 0–37)
Alkaline Phosphatase: 61 U/L (ref 39–117)
BILIRUBIN DIRECT: 0.1 mg/dL (ref 0.0–0.3)
TOTAL PROTEIN: 7.5 g/dL (ref 6.0–8.3)
Total Bilirubin: 0.6 mg/dL (ref 0.2–1.2)

## 2014-10-12 LAB — RENAL FUNCTION PANEL
Albumin: 3.7 g/dL (ref 3.5–5.2)
BUN: 15 mg/dL (ref 6–23)
CALCIUM: 9.6 mg/dL (ref 8.4–10.5)
CO2: 26 mEq/L (ref 19–32)
CREATININE: 1.3 mg/dL — AB (ref 0.4–1.2)
Chloride: 104 mEq/L (ref 96–112)
GFR: 49.19 mL/min — AB (ref >60.00)
GLUCOSE: 73 mg/dL (ref 70–99)
POTASSIUM: 4.1 meq/L (ref 3.5–5.1)
Phosphorus: 4.1 mg/dL (ref 2.3–4.6)
SODIUM: 142 meq/L (ref 135–145)

## 2014-10-12 LAB — LDL CHOLESTEROL, DIRECT: Direct LDL: 110.3 mg/dL

## 2014-10-12 LAB — TSH: TSH: 1.91 u[IU]/mL (ref 0.35–4.50)

## 2014-10-13 MED ORDER — SIMVASTATIN 10 MG PO TABS: 10.0000 mg | ORAL_TABLET | Freq: Every day | ORAL | Status: DC

## 2014-10-17 ENCOUNTER — Encounter: Payer: Self-pay | Admitting: Family Medicine

## 2014-10-17 DIAGNOSIS — Z0000 Encounter for general adult medical examination without abnormal findings: Secondary | ICD-10-CM

## 2014-10-17 HISTORY — DX: Encounter for general adult medical examination without abnormal findings: Z00.00

## 2014-10-17 MED ORDER — FUROSEMIDE 20 MG PO TABS: 20.0000 mg | ORAL_TABLET | Freq: Every day | ORAL | Status: DC | PRN

## 2014-10-17 NOTE — Assessment & Plan Note (Signed)
Doing well at home with family on current meds. No behavioral concerns.

## 2014-10-17 NOTE — Assessment & Plan Note (Addendum)
Tolerating statin, encouraged heart healthy diet, avoid trans fats, minimize simple carbs and saturated fats. Increase exercise as tolerated. Increase simvastatin today

## 2014-10-17 NOTE — Assessment & Plan Note (Signed)
Infrequent and a small amount. Likely hemorrhoids, encouraged probiotics, fiber supplement, 64 oz of clear fluids and consider anusol HC suppositories prn

## 2014-10-17 NOTE — Assessment & Plan Note (Signed)
Mild, stable, maintain adequate hydration

## 2014-10-17 NOTE — Assessment & Plan Note (Signed)
Patient denies any difficulties at home. No trouble with ADLs, depression or falls. No recent changes to vision or hearing. Is UTD with immunizations. Is UTD with screening. Discussed Advanced Directives, patient agrees to bring [place] copies of documents if can. Encouraged heart healthy diet, exercise as tolerated and adequate sleep. Declines flu shot today receiving elsewhere. No longer participates in colonoscopies, MGMs, paps, bone densities. Labs reviewed. Follows with opthamology and dentist sees Dr Sabino Gasser.

## 2014-10-17 NOTE — Assessment & Plan Note (Signed)
On Levothyroxine, continue to monitor 

## 2014-11-04 ENCOUNTER — Other Ambulatory Visit: Payer: Self-pay | Admitting: Family Medicine

## 2014-12-06 ENCOUNTER — Other Ambulatory Visit: Payer: Self-pay | Admitting: Family Medicine

## 2014-12-06 ENCOUNTER — Other Ambulatory Visit: Payer: Self-pay | Admitting: Family

## 2014-12-07 NOTE — Telephone Encounter (Signed)
Rx sent to the pharmacy by e-script.//AB/CMA 

## 2014-12-08 ENCOUNTER — Other Ambulatory Visit: Payer: Self-pay | Admitting: Family Medicine

## 2014-12-08 ENCOUNTER — Other Ambulatory Visit: Payer: Self-pay

## 2014-12-10 ENCOUNTER — Other Ambulatory Visit: Payer: Self-pay | Admitting: Family Medicine

## 2014-12-15 ENCOUNTER — Other Ambulatory Visit: Payer: Self-pay | Admitting: Family Medicine

## 2014-12-15 NOTE — Telephone Encounter (Signed)
Next office visit:  04/12/15

## 2014-12-31 ENCOUNTER — Other Ambulatory Visit: Payer: Self-pay | Admitting: Family

## 2015-01-03 ENCOUNTER — Telehealth: Payer: Self-pay | Admitting: Family Medicine

## 2015-01-03 NOTE — Telephone Encounter (Signed)
Caller name: deborah Relation to pt: daughter Call back number:  [phone] Pharmacy:  [facility] in [place]. Should be on file  Reason for call:   Patient daughter requesting a refill of furosemide for patient.

## 2015-01-05 ENCOUNTER — Other Ambulatory Visit: Payer: Self-pay | Admitting: Family

## 2015-01-05 ENCOUNTER — Other Ambulatory Visit: Payer: Self-pay | Admitting: Family Medicine

## 2015-01-06 MED ORDER — FUROSEMIDE 20 MG PO TABS: 20.0000 mg | ORAL_TABLET | Freq: Every day | ORAL | Status: DC | PRN

## 2015-01-06 NOTE — Telephone Encounter (Signed)
Rx sent to the pharmacy by e-script.//AB/CMA 

## 2015-01-07 ENCOUNTER — Telehealth: Payer: Self-pay

## 2015-01-07 ENCOUNTER — Other Ambulatory Visit: Payer: Self-pay | Admitting: Family

## 2015-01-07 NOTE — Telephone Encounter (Signed)
Daughter called and states that the pharmacy does not have the Rx that was sent via escribe today.  Called and spoke with pharmacy tech who cannot locate Lasix 20mg . States that the pharmacist was unable to take the verbal so I left it on the voice mail. Notified patient's daughter.

## 2015-01-07 NOTE — Telephone Encounter (Signed)
Rx sent to the pharmacy by e-script.//AB/CMA 

## 2015-03-07 ENCOUNTER — Other Ambulatory Visit: Payer: Self-pay | Admitting: Family Medicine

## 2015-03-17 ENCOUNTER — Other Ambulatory Visit: Payer: Self-pay | Admitting: Family Medicine

## 2015-03-17 NOTE — Telephone Encounter (Signed)
Med filled.  

## 2015-03-21 ENCOUNTER — Encounter: Payer: Self-pay | Admitting: Medical

## 2015-03-21 ENCOUNTER — Ambulatory Visit (INDEPENDENT_AMBULATORY_CARE_PROVIDER_SITE_OTHER): Payer: Medicare Other | Admitting: Medical

## 2015-03-21 VITALS — HR 65 | Temp 97.9°F | Wt 166.0 lb

## 2015-03-21 DIAGNOSIS — L600 Ingrowing nail: Secondary | ICD-10-CM | POA: Diagnosis not present

## 2015-03-21 MED ORDER — CEPHALEXIN 500 MG PO CAPS: 500.0000 mg | ORAL_CAPSULE | Freq: Two times a day (BID) | ORAL | Status: DC

## 2015-03-21 NOTE — Progress Notes (Signed)
Pre visit review using our clinic review tool, if applicable. No additional management support is needed unless otherwise documented below in the visit note. 

## 2015-03-21 NOTE — Patient Instructions (Signed)
Ingrown left greater toenail With infection. Rx cephalexin. Warm salt water soaks twice daily. Will go ahead and refer to podiatry since you may need excision of the nail.  Not rt side looks like may become ingrown as well.    If redness expands up foot or color worsen then be seen her, UC or ED.  Follow up her as needed prior to podiatrist appointment.

## 2015-03-21 NOTE — Assessment & Plan Note (Signed)
With infection. Rx cephalexin. Warm salt water soaks twice daily. Will go ahead and refer to podiatry since you may need excision of the nail.  Not rt side looks like may become ingrown as well.

## 2015-03-21 NOTE — Progress Notes (Signed)
Subjective:    Patient ID: Carrie Stephens, female    DOB: 05-04-1920, 81 y.o.   MRN: [id number]  HPI   Pt in with some left  Great toe pain  that is probably ingrown nail  per daughter. Lateral aspect. Complained of this last night. No rt great toe pain. Pt is not diabetic.    Review of Systems  Constitutional: Negative for fever, chills and fatigue.  Musculoskeletal:       Lt great toe pain.  Skin:       Toe pain left side.   Past Medical History  Diagnosis Date  . S/P lumpectomy of breast 01/2006    and sentinel node dissection   . Radiation 04/2006    s/p external radiation -Arimidex since 04/12/2006  . Alzheimer disease     moderate to severe  . Hyperlipidemia   . Depression   . History of UTI   . Vision loss, left eye     secondary to retinal hemorrhage, unclear etiology  . Osteoporosis     s/p zometa  3.3 mg IV 05/2007  . Thyroid nodule   . 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 Renal insufficiency 11/15/2013  . Hyperglycemia 11/15/2013  . Medicare annual wellness visit, subsequent 10/17/2014    History   Social History  . Marital Status: Widowed    Spouse Name: N/A  . Number of Children: 3  . Years of Education: N/A   Occupational History  . Retired     [employer]   Social History Main Topics  . Smoking status: Former Research scientist (life sciences)  . Smokeless tobacco: Never Used     Comment: light smoker- smoked 3 or 4 cigarettes a day for 55 years  . Alcohol Use: Not on file  . Drug Use: Not on file  . Sexual Activity: Not on file   Other Topics Concern  . 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  She has a total of 3 children, two daughters and one son.     No alcohol.  She quit tobacco approximately 2 years ago.  She   was a light  smoker, smoked 3 or 4 cigarettes a day for 55 years.      Past Surgical History  Procedure Laterality Date  . Vaginal hysterectomy  1960's    status post  . Lobectomy  08/2009    thyroid right-mixed and macrofollicular adenoma, 8.5 cm, malignant features are not identified    Family History  Problem Relation Age of Onset  . Breast cancer Mother   . Diabetes Father     adult onset type 2  . Other Neg Hx     No report of heart disease or stroke in the family    Allergies  Allergen Reactions  . Codeine Phosphate     REACTION: unspecified  . Ibuprofen     REACTION: unspecified  . Moxifloxacin     REACTION: rash    Current Outpatient Prescriptions on File Prior to Visit  Medication Sig Dispense Refill  . aspirin 81 MG tablet Take 81 mg by mouth daily.      . calcium carbonate (OS-CAL) 600 MG TABS Take 600 mg by mouth 2 (two) times daily  with a meal.      . Cholecalciferol (VITAMIN D3) 1000 UNITS tablet Take 1,000 Units by mouth daily.      . Cyanocobalamin 1000 MCG CAPS Take by mouth.    . donepezil (ARICEPT) 10 MG tablet TAKE 1 TABLET BY MOUTH EVERY DAY AT BEDTIME 30 tablet 1  . furosemide (LASIX) 20 MG tablet TAKE 1 TABLET BY MOUTH EVERY DAY 90 tablet 0  . KLOR-CON M10 10 MEQ tablet TAKE 1 TABLET BY MOUTH EVERY DAY 90 tablet 0  . levothyroxine (SYNTHROID, LEVOTHROID) 50 MCG tablet TAKE 1 TABLET (50 MCG TOTAL) BY MOUTH DAILY. 90 tablet 1  . mirtazapine (REMERON) 15 MG tablet TAKE 1 TABLET (15 MG TOTAL) BY MOUTH AT BEDTIME. 90 tablet 1  . NAMENDA XR 28 MG CP24 24 hr capsule TAKE ONE CAPSULE BY MOUTH EVERY DAY 30 capsule 5  . simvastatin (ZOCOR) 10 MG tablet TAKE 1 TABLET (10 MG TOTAL) BY MOUTH DAILY. 30 tablet 1   No current facility-administered medications on file prior to visit.    Pulse 65  Temp(Src) 97.9 F (36.6 C) (Oral)  Wt 166 lb (75.297 kg)  SpO2 100%       Objective:   Physical Exam  General- no acute distress. Left great toe- Lateral aspect of nail  looks ingrown(very tender to palpation). Creamy brownish discharge on the lateral  Edge of nail. Some redness to medial aspect as well. Rt great toe- nail look malformed. And possibly ingrown.       Assessment & Plan:

## 2015-03-29 ENCOUNTER — Other Ambulatory Visit (HOSPITAL_COMMUNITY): Payer: Self-pay | Admitting: Foot & Ankle Surgery

## 2015-03-29 DIAGNOSIS — L03031 Cellulitis of right toe: Secondary | ICD-10-CM

## 2015-03-29 DIAGNOSIS — L03032 Cellulitis of left toe: Secondary | ICD-10-CM

## 2015-03-31 ENCOUNTER — Ambulatory Visit (HOSPITAL_COMMUNITY)
Admission: RE | Admit: 2015-03-31 | Discharge: 2015-03-31 | Disposition: A | Discharge status: Home / Self Care | Payer: Medicare Other | Source: Ambulatory Visit | Attending: Cardiology | Admitting: Cardiology

## 2015-03-31 DIAGNOSIS — L03031 Cellulitis of right toe: Secondary | ICD-10-CM | POA: Insufficient documentation

## 2015-03-31 DIAGNOSIS — L03032 Cellulitis of left toe: Secondary | ICD-10-CM | POA: Insufficient documentation

## 2015-03-31 NOTE — Progress Notes (Signed)
ABI completed. ABIs were non-applicable due to non-compressible vessels however, Doppler waveforms indicate normal triphasic flow. The right toe-brachial index was abnormal while the left toe-brachial index was normal. Brianna L Mazza,RVT

## 2015-04-11 ENCOUNTER — Telehealth (HOSPITAL_COMMUNITY): Payer: Self-pay | Admitting: *Deleted

## 2015-04-12 ENCOUNTER — Ambulatory Visit: Payer: Medicare Other | Admitting: Family Medicine

## 2015-04-28 ENCOUNTER — Encounter: Payer: Self-pay | Admitting: Family Medicine

## 2015-04-28 ENCOUNTER — Ambulatory Visit (INDEPENDENT_AMBULATORY_CARE_PROVIDER_SITE_OTHER): Payer: Medicare Other | Admitting: Family Medicine

## 2015-04-28 VITALS — BP 131/60 | HR 47 | Temp 98.4°F | Wt 163.4 lb

## 2015-04-28 DIAGNOSIS — G309 Alzheimer's disease, unspecified: Secondary | ICD-10-CM

## 2015-04-28 DIAGNOSIS — E039 Hypothyroidism, unspecified: Secondary | ICD-10-CM | POA: Diagnosis not present

## 2015-04-28 DIAGNOSIS — E785 Hyperlipidemia, unspecified: Secondary | ICD-10-CM

## 2015-04-28 DIAGNOSIS — F0280 Dementia in other diseases classified elsewhere without behavioral disturbance: Secondary | ICD-10-CM

## 2015-04-28 MED ORDER — LEVOTHYROXINE SODIUM 50 MCG PO TABS: ORAL_TABLET | ORAL | Status: DC

## 2015-04-28 MED ORDER — FUROSEMIDE 20 MG PO TABS: 20.0000 mg | ORAL_TABLET | Freq: Every day | ORAL | Status: DC

## 2015-04-28 MED ORDER — SIMVASTATIN 10 MG PO TABS: ORAL_TABLET | ORAL | Status: DC

## 2015-04-28 MED ORDER — MEMANTINE HCL ER 28 MG PO CP24: 28.0000 mg | ORAL_CAPSULE | Freq: Every day | ORAL | Status: DC

## 2015-04-28 MED ORDER — POTASSIUM CHLORIDE CRYS ER 10 MEQ PO TBCR: 10.0000 meq | EXTENDED_RELEASE_TABLET | Freq: Every day | ORAL | Status: DC

## 2015-04-28 MED ORDER — DONEPEZIL HCL 10 MG PO TABS: 10.0000 mg | ORAL_TABLET | Freq: Every day | ORAL | Status: DC

## 2015-04-28 NOTE — Patient Instructions (Signed)
Hypothyroidism The thyroid is a large gland located in the lower front of your neck. The thyroid gland helps control metabolism. Metabolism is how your body handles food. It controls metabolism with the hormone thyroxine. When this gland is underactive (hypothyroid), it produces too little hormone.  CAUSES These include:   Absence or destruction of thyroid tissue.  Goiter due to iodine deficiency.  Goiter due to medications.  Congenital defects (since birth).  Problems with the pituitary. This causes a lack of TSH (thyroid stimulating hormone). This hormone tells the thyroid to turn out more hormone. SYMPTOMS  Lethargy (feeling as though you have no energy)  Cold intolerance  Weight gain (in spite of normal food intake)  Dry skin  Coarse hair  Menstrual irregularity (if severe, may lead to infertility)  Slowing of thought processes Cardiac problems are also caused by insufficient amounts of thyroid hormone. Hypothyroidism in the newborn is cretinism, and is an extreme form. It is important that this form be treated adequately and immediately or it will lead rapidly to retarded physical and mental development. DIAGNOSIS  To prove hypothyroidism, your caregiver may do blood tests and ultrasound tests. Sometimes the signs are hidden. It may be necessary for your caregiver to watch this illness with blood tests either before or after diagnosis and treatment. TREATMENT  Low levels of thyroid hormone are increased by using synthetic thyroid hormone. This is a safe, effective treatment. It usually takes about four weeks to gain the full effects of the medication. After you have the full effect of the medication, it will generally take another four weeks for problems to leave. Your caregiver may start you on low doses. If you have had heart problems the dose may be gradually increased. It is generally not an emergency to get rapidly to normal. HOME CARE INSTRUCTIONS   Take your  medications as your caregiver suggests. Let your caregiver know of any medications you are taking or start taking. Your caregiver will help you with dosage schedules.  As your condition improves, your dosage needs may increase. It will be necessary to have continuing blood tests as suggested by your caregiver.  Report all suspected medication side effects to your caregiver. SEEK MEDICAL CARE IF: Seek medical care if you develop:  Sweating.  Tremulousness (tremors).  Anxiety.  Rapid weight loss.  Heat intolerance.  Emotional swings.  Diarrhea.  Weakness. SEEK IMMEDIATE MEDICAL CARE IF:  You develop chest pain, an irregular heart beat (palpitations), or a rapid heart beat. MAKE SURE YOU:   Understand these instructions.  Will watch your condition.  Will get help right away if you are not doing well or get worse. Document Released: 12/10/2005 Document Revised: 03/03/2012 Document Reviewed: 07/30/2008 Exit[employer] Patient Information 2015 [employer]. This information is not intended to replace advice given to you by your health care provider. Make sure you discuss any questions you have with your health care provider.  

## 2015-04-28 NOTE — Progress Notes (Signed)
Pre visit review using our clinic review tool, if applicable. No additional management support is needed unless otherwise documented below in the visit note. 

## 2015-05-14 ENCOUNTER — Encounter: Payer: Self-pay | Admitting: Family Medicine

## 2015-05-14 NOTE — Assessment & Plan Note (Signed)
On Levothyroxine, continue to monitor 

## 2015-05-14 NOTE — Assessment & Plan Note (Signed)
Tolerating statin, encouraged heart healthy diet, avoid trans fats, minimize simple carbs and saturated fats. Increase exercise as tolerated 

## 2015-05-14 NOTE — Progress Notes (Signed)
Carrie Stephens  [id number] [birth year] 05/14/2015      Progress Note-Follow Up  Subjective  Chief Complaint  Chief Complaint  Patient presents with  . Follow-up    HPI  Patient is a 81 y.o. female in today for routine medical care. Patient is in today with her daughter. She continues to do well at home. She is eating well and sleeping well. Her memory continues to decline but her behavior has been very easy to manage. They've had no acute concerns or acute illness. Denies CP/palp/SOB/HA/congestion/fevers/GI or GU c/o. Taking meds as prescribed  Past Medical History  Diagnosis Date  . S/P lumpectomy of breast 01/2006    and sentinel node dissection   . Radiation 04/2006    s/p external radiation -Arimidex since 04/12/2006  . Alzheimer disease     moderate to severe  . Hyperlipidemia   . Depression   . History of UTI   . Vision loss, left eye     secondary to retinal hemorrhage, unclear etiology  . Osteoporosis     s/p zometa  3.3 mg IV 05/2007  . Thyroid nodule   . 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 Renal insufficiency 11/15/2013  . Hyperglycemia 11/15/2013  . Medicare annual wellness visit, subsequent 10/17/2014    Past Surgical History  Procedure Laterality Date  . Vaginal hysterectomy  1960's    status post  . Lobectomy  08/2009    thyroid right-mixed and macrofollicular adenoma, 8.5 cm, malignant features are not identified    Family History  Problem Relation Age of Onset  . Breast cancer Mother   . Diabetes Father     adult onset type 2  . Other Neg Hx     No report of heart disease or stroke in the family    History   Social History  . Marital Status: Widowed    Spouse Name: N/A  . Number of Children: 3  . Years of Education: N/A   Occupational History  . Retired     [employer]   Social History Main  Topics  . Smoking status: Former Research scientist (life sciences)  . Smokeless tobacco: Never Used     Comment: light smoker- smoked 3 or 4 cigarettes a day for 55 years  . Alcohol Use: Not on file  . Drug Use: Not on file  . Sexual Activity: Not on file   Other Topics Concern  . 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  She has a total of 3 children, two daughters and one son.     No alcohol.  She quit tobacco approximately 2 years ago.  She   was a light smoker, smoked 3 or 4 cigarettes a day for 55 years.      Current Outpatient Prescriptions on File Prior to Visit  Medication Sig Dispense Refill  . aspirin 81 MG tablet Take 81 mg by mouth daily.      . calcium carbonate (OS-CAL) 600 MG TABS Take 600 mg by mouth 2 (two) times daily with a meal.      . Cholecalciferol (VITAMIN D3) 1000 UNITS tablet Take 1,000 Units by mouth daily.      . Cyanocobalamin 1000 MCG CAPS Take by mouth.    Marland Kitchen  mirtazapine (REMERON) 15 MG tablet TAKE 1 TABLET (15 MG TOTAL) BY MOUTH AT BEDTIME. 90 tablet 1   No current facility-administered medications on file prior to visit.    Allergies  Allergen Reactions  . Codeine Phosphate     REACTION: unspecified  . Ibuprofen     REACTION: unspecified  . Moxifloxacin     REACTION: rash    Review of Systems  Review of Systems  Constitutional: Negative for fever, chills and malaise/fatigue.  HENT: Negative for congestion, hearing loss and nosebleeds.   Eyes: Negative for discharge.  Respiratory: Negative for cough, sputum production, shortness of breath and wheezing.   Cardiovascular: Negative for chest pain, palpitations and leg swelling.  Gastrointestinal: Negative for heartburn, nausea, vomiting, abdominal pain, diarrhea, constipation and blood in stool.  Genitourinary: Negative for dysuria, urgency, frequency and hematuria.  Musculoskeletal: Negative for myalgias, back pain and  falls.  Skin: Negative for rash.  Neurological: Positive for weakness. Negative for dizziness, tremors, sensory change, focal weakness, loss of consciousness and headaches.  Endo/Heme/Allergies: Negative for polydipsia. Does not bruise/bleed easily.  Psychiatric/Behavioral: Negative for depression and suicidal ideas. The patient is not nervous/anxious and does not have insomnia.     Objective  BP 131/60 mmHg  Pulse 47  Temp(Src) 98.4 F (36.9 C) (Oral)  Wt 163 lb 6 oz (74.106 kg)  Physical Exam  Physical Exam  Constitutional: She is oriented to person, place, and time and well-developed, well-nourished, and in no distress. No distress.  HENT:  Head: Normocephalic and atraumatic.  Right Ear: External ear normal.  Left Ear: External ear normal.  Nose: Nose normal.  Mouth/Throat: Oropharynx is clear and moist. No oropharyngeal exudate.  Eyes: Conjunctivae are normal. Pupils are equal, round, and reactive to light. Right eye exhibits no discharge. Left eye exhibits no discharge. No scleral icterus.  Neck: Normal range of motion. Neck supple. No thyromegaly present.  Cardiovascular: Normal rate, regular rhythm, normal heart sounds and intact distal pulses.   Pulmonary/Chest: Effort normal and breath sounds normal. No respiratory distress. She has no wheezes. She has no rales.  Abdominal: Soft. Bowel sounds are normal. She exhibits no distension and no mass. There is no tenderness.  Musculoskeletal: Normal range of motion. She exhibits no edema or tenderness.  Lymphadenopathy:    She has no cervical adenopathy.  Neurological: She is alert and oriented to person, place, and time. She has normal reflexes. No cranial nerve deficit. Coordination normal.  Skin: Skin is warm and dry. No rash noted. She is not diaphoretic.  Psychiatric: Mood, memory and affect normal.    Lab Results  Component Value Date   TSH 1.91 10/11/2014   Lab Results  Component Value Date   WBC 6.6 10/11/2014    HGB 14.0 10/11/2014   HCT 42.8 10/11/2014   MCV 92.8 10/11/2014   PLT 317.0 10/11/2014   Lab Results  Component Value Date   CREATININE 1.3* 10/11/2014   BUN 15 10/11/2014   NA 142 10/11/2014   K 4.1 10/11/2014   CL 104 10/11/2014   CO2 26 10/11/2014   Lab Results  Component Value Date   ALT 15 10/11/2014   AST 27 10/11/2014   ALKPHOS 61 10/11/2014   BILITOT 0.6 10/11/2014   Lab Results  Component Value Date   CHOL 209* 10/11/2014   Lab Results  Component Value Date   HDL 67.00 10/11/2014   Lab Results  Component Value Date   LDLCALC 59 06/03/2014   Lab Results  Component Value Date   TRIG 258.0* 10/11/2014   Lab Results  Component Value Date   CHOLHDL 3 10/11/2014     Assessment & Plan  Hypothyroidism On Levothyroxine, continue to monitor   ALZHEIMER'S DISEASE Patient is in today with her daughter, she is doing well at home. No trouble with po intake or voiding. Performing ADLs with family assistance   Hyperlipidemia Tolerating statin, encouraged heart healthy diet, avoid trans fats, minimize simple carbs and saturated fats. Increase exercise as tolerated

## 2015-05-14 NOTE — Assessment & Plan Note (Signed)
Patient is in today with her daughter, she is doing well at home. No trouble with po intake or voiding. Performing ADLs with family assistance

## 2015-06-03 ENCOUNTER — Other Ambulatory Visit: Payer: Self-pay | Admitting: Family Medicine

## 2015-06-13 ENCOUNTER — Other Ambulatory Visit: Payer: Self-pay | Admitting: Family Medicine

## 2015-06-16 ENCOUNTER — Telehealth (HOSPITAL_COMMUNITY): Payer: Self-pay | Admitting: *Deleted

## 2015-10-24 ENCOUNTER — Telehealth: Payer: Self-pay | Admitting: Family Medicine

## 2015-10-24 NOTE — Telephone Encounter (Signed)
I have had a meeting get cancelled so we could see her Dec 1 at 79 oclock if that would work for them, if not they are welcome to see another provider or I can look at schedule again

## 2015-10-24 NOTE — Telephone Encounter (Signed)
Caller name:Underwood,Deborah Relation to BM:[id number]  Call back [phone]  Reason for call:  Daughter requesting patient conduct physical with another provider due to PCP next available appointment is not until April. Patient was orignally scheduled for 11/03/15 but office had to [facility]. Please advise

## 2015-10-25 NOTE — Telephone Encounter (Signed)
LVM advising Carrie Stephens of message of below

## 2015-10-30 ENCOUNTER — Other Ambulatory Visit: Payer: Self-pay | Admitting: Family Medicine

## 2015-11-03 ENCOUNTER — Encounter: Payer: Medicare Other | Admitting: Family Medicine

## 2015-11-03 ENCOUNTER — Other Ambulatory Visit: Payer: Self-pay | Admitting: Family Medicine

## 2015-11-23 ENCOUNTER — Telehealth: Payer: Self-pay | Admitting: Behavioral Health

## 2015-11-23 NOTE — Telephone Encounter (Signed)
Unable to reach patient/patient's daughter at time of Pre-Visit Call.  Left message for patient/patient's daughter to return call when available.

## 2015-11-24 ENCOUNTER — Encounter: Payer: Self-pay | Admitting: Family Medicine

## 2015-11-24 ENCOUNTER — Ambulatory Visit (INDEPENDENT_AMBULATORY_CARE_PROVIDER_SITE_OTHER): Payer: Medicare Other | Admitting: Family Medicine

## 2015-11-24 VITALS — BP 108/62 | HR 52 | Temp 98.2°F | Ht 68.0 in | Wt 166.5 lb

## 2015-11-24 DIAGNOSIS — N289 Disorder of kidney and ureter, unspecified: Secondary | ICD-10-CM

## 2015-11-24 DIAGNOSIS — Z0000 Encounter for general adult medical examination without abnormal findings: Secondary | ICD-10-CM

## 2015-11-24 DIAGNOSIS — R319 Hematuria, unspecified: Secondary | ICD-10-CM

## 2015-11-24 DIAGNOSIS — G309 Alzheimer's disease, unspecified: Secondary | ICD-10-CM

## 2015-11-24 DIAGNOSIS — M810 Age-related osteoporosis without current pathological fracture: Secondary | ICD-10-CM

## 2015-11-24 DIAGNOSIS — F0280 Dementia in other diseases classified elsewhere without behavioral disturbance: Secondary | ICD-10-CM

## 2015-11-24 DIAGNOSIS — L989 Disorder of the skin and subcutaneous tissue, unspecified: Secondary | ICD-10-CM | POA: Diagnosis not present

## 2015-11-24 DIAGNOSIS — E785 Hyperlipidemia, unspecified: Secondary | ICD-10-CM

## 2015-11-24 DIAGNOSIS — H3530 Unspecified macular degeneration: Secondary | ICD-10-CM

## 2015-11-24 DIAGNOSIS — R739 Hyperglycemia, unspecified: Secondary | ICD-10-CM

## 2015-11-24 DIAGNOSIS — Z23 Encounter for immunization: Secondary | ICD-10-CM | POA: Diagnosis not present

## 2015-11-24 DIAGNOSIS — E039 Hypothyroidism, unspecified: Secondary | ICD-10-CM

## 2015-11-24 HISTORY — DX: Unspecified macular degeneration: H35.30

## 2015-11-24 LAB — HEMOGLOBIN A1C: HEMOGLOBIN A1C: 5.7 % (ref 4.6–6.5)

## 2015-11-24 LAB — COMPREHENSIVE METABOLIC PANEL
ALBUMIN: 3.9 g/dL (ref 3.5–5.2)
ALK PHOS: 54 U/L (ref 39–117)
ALT: 8 U/L (ref 0–35)
AST: 17 U/L (ref 0–37)
BUN: 20 mg/dL (ref 6–23)
CHLORIDE: 109 meq/L (ref 96–112)
CO2: 26 mEq/L (ref 19–32)
Calcium: 9.2 mg/dL (ref 8.4–10.5)
Creatinine, Ser: 1.22 mg/dL — ABNORMAL HIGH (ref 0.40–1.20)
GFR: 52.81 mL/min — ABNORMAL LOW (ref >60.00)
GLUCOSE: 90 mg/dL (ref 70–99)
POTASSIUM: 3.9 meq/L (ref 3.5–5.1)
SODIUM: 145 meq/L (ref 135–145)
Total Bilirubin: 0.7 mg/dL (ref 0.2–1.2)
Total Protein: 6.6 g/dL (ref 6.0–8.3)

## 2015-11-24 LAB — CBC
HEMATOCRIT: 41.2 % (ref 36.0–46.0)
Hemoglobin: 13.4 g/dL (ref 12.0–15.0)
MCHC: 32.5 g/dL (ref 30.0–36.0)
MCV: 92.7 fl (ref 78.0–100.0)
Platelets: 234 10*3/uL (ref 150.0–400.0)
RBC: 4.45 Mil/uL (ref 3.87–5.11)
RDW: 13.6 % (ref 11.5–15.5)
WBC: 5.8 10*3/uL (ref 4.0–10.5)

## 2015-11-24 LAB — TSH: TSH: 1.35 u[IU]/mL (ref 0.35–4.50)

## 2015-11-24 LAB — LDL CHOLESTEROL, DIRECT: LDL DIRECT: 72 mg/dL

## 2015-11-24 LAB — LIPID PANEL
CHOL/HDL RATIO: 3
CHOLESTEROL: 158 mg/dL (ref 0–200)
HDL: 58.6 mg/dL (ref >39.00)
NonHDL: 99.15
Triglycerides: 232 mg/dL — ABNORMAL HIGH (ref 0.0–149.0)
VLDL: 46.4 mg/dL — AB (ref 0.0–40.0)

## 2015-11-24 LAB — VITAMIN B12: Vitamin B-12: 923 pg/mL — ABNORMAL HIGH (ref 211–911)

## 2015-11-24 LAB — VITAMIN D 25 HYDROXY (VIT D DEFICIENCY, FRACTURES): VITD: 42.54 ng/mL (ref 30.00–100.00)

## 2015-11-24 MED ORDER — SIMVASTATIN 10 MG PO TABS: ORAL_TABLET | ORAL | Status: DC

## 2015-11-24 MED ORDER — LEVOTHYROXINE SODIUM 50 MCG PO TABS: ORAL_TABLET | ORAL | Status: DC

## 2015-11-24 MED ORDER — DONEPEZIL HCL 10 MG PO TABS
10.0000 mg | ORAL_TABLET | Freq: Every day | ORAL | Status: DC
Start: 2015-11-24 — End: 2016-07-10

## 2015-11-24 MED ORDER — POTASSIUM CHLORIDE CRYS ER 10 MEQ PO TBCR: 10.0000 meq | EXTENDED_RELEASE_TABLET | Freq: Every day | ORAL | Status: DC

## 2015-11-24 MED ORDER — FUROSEMIDE 20 MG PO TABS: 20.0000 mg | ORAL_TABLET | Freq: Every day | ORAL | Status: DC

## 2015-11-24 NOTE — Progress Notes (Signed)
Subjective:    Patient ID: Carrie Stephens, female    DOB: 1920-09-10, 81 y.o.   MRN: [id number]  Chief Complaint  Patient presents with  . Medicare Wellness    HPI Patient is in today for annual wellness exam and follow-up on numerous concerns. Feels well today. Has noted a great improvement in her gastrointestinal concerns since starting probiotics. She is doing well in her home and continues to eat well. Her dementia is progressing but her daughter reports they're doing fairly well. Most nights she sleeps well but her daughter notes that each evening she has odd movements in her legs suggestive of restless leg syndrome. No new complaints. Is feeling fine and left eye but no recent changes in vision as far as her daughter can tell. Denies CP/palp/SOB/HA/congestion/fevers/GI or GU c/o. Taking meds as prescribed  Past Medical History  Diagnosis Date  . S/P lumpectomy of breast 01/2006    and sentinel node dissection   . Radiation 04/2006    s/p external radiation -Arimidex since 04/12/2006  . Alzheimer disease     moderate to severe  . Hyperlipidemia   . Depression   . History of UTI   . Vision loss, left eye     secondary to retinal hemorrhage, unclear etiology  . Osteoporosis     s/p zometa  3.3 mg IV 05/2007  . Thyroid nodule   . 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 Renal insufficiency 11/15/2013  . Hyperglycemia 11/15/2013  . Medicare annual wellness visit, subsequent 10/17/2014  . Macular degeneration 11/24/2015    Past Surgical History  Procedure Laterality Date  . Vaginal hysterectomy  1960's    status post  . Lobectomy  08/2009    thyroid right-mixed and macrofollicular adenoma, 8.5 cm, malignant features are not identified    Family History  Problem Relation Age of Onset  . Breast cancer Mother   . Diabetes Father     adult  onset type 2  . Other Neg Hx     No report of heart disease or stroke in the family    Social History   Social History  . Marital Status: Widowed    Spouse Name: N/A  . Number of Children: 3  . Years of Education: N/A   Occupational History  . Retired     [employer]   Social History Main Topics  . Smoking status: Former Research scientist (life sciences)  . Smokeless tobacco: Never Used     Comment: light smoker- smoked 3 or 4 cigarettes a day for 55 years  . Alcohol Use: Not on file  . Drug Use: Not on file  . Sexual Activity: Not on file   Other Topics Concern  . 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  She has a total of 3 children, two daughters and one son.     No alcohol.  She quit tobacco approximately 2 years ago.  She   was a light smoker, smoked 3 or 4 cigarettes a day for 55 years.      Outpatient Prescriptions Prior to Visit  Medication Sig Dispense Refill  . aspirin 81 MG tablet Take 81 mg by mouth daily.      . calcium  carbonate (OS-CAL) 600 MG TABS Take 600 mg by mouth 2 (two) times daily with a meal.      . Cholecalciferol (VITAMIN D3) 1000 UNITS tablet Take 1,000 Units by mouth daily.      . Cyanocobalamin 1000 MCG CAPS Take by mouth.    . mirtazapine (REMERON) 15 MG tablet TAKE 1 TABLET (15 MG TOTAL) BY MOUTH AT BEDTIME. 90 tablet 1  . NAMENDA XR 28 MG CP24 24 hr capsule TAKE 1 CAPSULE (28 MG TOTAL) BY MOUTH DAILY. 30 capsule 6  . donepezil (ARICEPT) 10 MG tablet Take 1 tablet (10 mg total) by mouth at bedtime. 30 tablet 6  . furosemide (LASIX) 20 MG tablet Take 1 tablet (20 mg total) by mouth daily. 90 tablet 1  . KLOR-CON M10 10 MEQ tablet TAKE 1 TABLET BY MOUTH EVERY DAY 90 tablet 0  . levothyroxine (SYNTHROID, LEVOTHROID) 50 MCG tablet TAKE 1 TABLET (50 MCG TOTAL) BY MOUTH DAILY. 30 tablet 5  . simvastatin (ZOCOR) 10 MG tablet TAKE 1 TABLET (10 MG TOTAL) BY MOUTH DAILY. 90  tablet 1  . NAMENDA XR 28 MG CP24 24 hr capsule TAKE 1 CAPSULE (28 MG TOTAL) BY MOUTH DAILY. 30 capsule 6  . potassium chloride (KLOR-CON M10) 10 MEQ tablet Take 1 tablet (10 mEq total) by mouth daily. 30 tablet 6   No facility-administered medications prior to visit.    Allergies  Allergen Reactions  . Codeine Phosphate     REACTION: unspecified  . Ibuprofen     REACTION: unspecified  . Moxifloxacin     REACTION: rash    Review of Systems  Constitutional: Negative for fever, chills and malaise/fatigue.  HENT: Negative for congestion and hearing loss.   Eyes: Negative for discharge.  Respiratory: Negative for cough, sputum production and shortness of breath.   Cardiovascular: Negative for chest pain, palpitations and leg swelling.  Gastrointestinal: Negative for heartburn, nausea, vomiting, abdominal pain, diarrhea, constipation and blood in stool.  Genitourinary: Negative for dysuria, urgency, frequency and hematuria.  Musculoskeletal: Negative for myalgias, back pain and falls.  Skin: Negative for rash.  Neurological: Negative for dizziness, sensory change, loss of consciousness, weakness and headaches.  Endo/Heme/Allergies: Negative for environmental allergies. Does not bruise/bleed easily.  Psychiatric/Behavioral: Negative for depression and suicidal ideas. The patient is not nervous/anxious and does not have insomnia.        Objective:    Physical Exam  Constitutional: She is oriented to person, place, and time. She appears well-developed and well-nourished. No distress.  HENT:  Head: Normocephalic and atraumatic.  Eyes: Conjunctivae are normal.  Neck: Neck supple. No thyromegaly present.  Cardiovascular: Normal rate, regular rhythm and normal heart sounds.   No murmur heard. Pulmonary/Chest: Effort normal and breath sounds normal. No respiratory distress.  Abdominal: Soft. Bowel sounds are normal. She exhibits no distension and no mass. There is no tenderness.    Musculoskeletal: She exhibits no edema.  Lymphadenopathy:    She has no cervical adenopathy.  Neurological: She is alert and oriented to person, place, and time.  Skin: Skin is warm and dry.  Psychiatric: She has a normal mood and affect. Her behavior is normal.    BP 108/62 mmHg  Pulse 52  Temp(Src) 98.2 F (36.8 C) (Oral)  Ht 5\' 8"  (1.727 m)  Wt 166 lb 8 oz (75.524 kg)  BMI 25.32 kg/m2  SpO2 97% Wt Readings from Last 3 Encounters:  11/24/15 166 lb 8 oz (75.524 kg)  04/28/15 163 lb  6 oz (74.106 kg)  03/21/15 166 lb (75.297 kg)     Lab Results  Component Value Date   WBC 5.8 11/24/2015   HGB 13.4 11/24/2015   HCT 41.2 11/24/2015   PLT 234.0 11/24/2015   GLUCOSE 90 11/24/2015   CHOL 158 11/24/2015   TRIG 232.0* 11/24/2015   HDL 58.60 11/24/2015   LDLDIRECT 72.0 11/24/2015   LDLCALC 59 06/03/2014   ALT 8 11/24/2015   AST 17 11/24/2015   NA 145 11/24/2015   K 3.9 11/24/2015   CL 109 11/24/2015   CREATININE 1.22* 11/24/2015   BUN 20 11/24/2015   CO2 26 11/24/2015   TSH 1.35 11/24/2015   HGBA1C 5.7 11/24/2015    Lab Results  Component Value Date   TSH 1.35 11/24/2015   Lab Results  Component Value Date   WBC 5.8 11/24/2015   HGB 13.4 11/24/2015   HCT 41.2 11/24/2015   MCV 92.7 11/24/2015   PLT 234.0 11/24/2015   Lab Results  Component Value Date   NA 145 11/24/2015   K 3.9 11/24/2015   CO2 26 11/24/2015   GLUCOSE 90 11/24/2015   BUN 20 11/24/2015   CREATININE 1.22* 11/24/2015   BILITOT 0.7 11/24/2015   ALKPHOS 54 11/24/2015   AST 17 11/24/2015   ALT 8 11/24/2015   PROT 6.6 11/24/2015   ALBUMIN 3.9 11/24/2015   CALCIUM 9.2 11/24/2015   GFR 52.81* 11/24/2015   Lab Results  Component Value Date   CHOL 158 11/24/2015   Lab Results  Component Value Date   HDL 58.60 11/24/2015   Lab Results  Component Value Date   LDLCALC 59 06/03/2014   Lab Results  Component Value Date   TRIG 232.0* 11/24/2015   Lab Results  Component Value Date    CHOLHDL 3 11/24/2015   Lab Results  Component Value Date   HGBA1C 5.7 11/24/2015       Assessment & Plan:   Problem List Items Addressed This Visit    ALZHEIMER'S DISEASE    Tolerating meds, symptoms slowly progressing but doing well at home with daughter.       Relevant Medications   donepezil (ARICEPT) 10 MG tablet   Hematuria   Relevant Orders   TSH (Completed)   CBC (Completed)   Comprehensive metabolic panel (Completed)   Lipid panel (Completed)   VITAMIN D 25 Hydroxy (Vit-D Deficiency, Fractures) (Completed)   Hemoglobin A1c (Completed)   Vitamin B12 (Completed)   Urinalysis   Urine culture   Hyperglycemia    hgba1c acceptable, minimize simple carbs. Increase exercise as tolerated.       Relevant Orders   TSH (Completed)   CBC (Completed)   Comprehensive metabolic panel (Completed)   Lipid panel (Completed)   VITAMIN D 25 Hydroxy (Vit-D Deficiency, Fractures) (Completed)   Hemoglobin A1c (Completed)   Vitamin B12 (Completed)   Urinalysis   Urine culture   Hyperlipidemia    Tolerating statin, encouraged heart healthy diet, avoid trans fats, minimize simple carbs and saturated fats. Increase exercise as tolerated      Relevant Medications   simvastatin (ZOCOR) 10 MG tablet   furosemide (LASIX) 20 MG tablet   Other Relevant Orders   TSH (Completed)   CBC (Completed)   Comprehensive metabolic panel (Completed)   Lipid panel (Completed)   VITAMIN D 25 Hydroxy (Vit-D Deficiency, Fractures) (Completed)   Hemoglobin A1c (Completed)   Vitamin B12 (Completed)   Hypothyroidism    On Levothyroxine, continue to monitor  Relevant Medications   levothyroxine (SYNTHROID, LEVOTHROID) 50 MCG tablet   Other Relevant Orders   TSH (Completed)   CBC (Completed)   Comprehensive metabolic panel (Completed)   Lipid panel (Completed)   VITAMIN D 25 Hydroxy (Vit-D Deficiency, Fractures) (Completed)   Hemoglobin A1c (Completed)   Vitamin B12 (Completed)   Macular  degeneration   Relevant Orders   TSH (Completed)   CBC (Completed)   Comprehensive metabolic panel (Completed)   Lipid panel (Completed)   VITAMIN D 25 Hydroxy (Vit-D Deficiency, Fractures) (Completed)   Hemoglobin A1c (Completed)   Vitamin B12 (Completed)   Urinalysis   Urine culture   Medicare annual wellness visit, subsequent    Patient denies any difficulties at home. No trouble with ADLs, depression or falls. See EMR for functional status screen and depression screen. No recent changes to vision or hearing. Is UTD with immunizations. Is UTD with screening. Discussed Advanced Directives. Encouraged heart healthy diet, exercise as tolerated and adequate sleep. See patient's problem list for health risk factors to monitor. See AVS for preventative healthcare recommendation schedule. Has aged out of screening MGM, paps, colonoscopies. Not a candidate for Zostavax but is given a Prevnar shot today Labs ordered and reviewed.       Osteoporosis   Relevant Orders   TSH (Completed)   CBC (Completed)   Comprehensive metabolic panel (Completed)   Lipid panel (Completed)   VITAMIN D 25 Hydroxy (Vit-D Deficiency, Fractures) (Completed)   Hemoglobin A1c (Completed)   Vitamin B12 (Completed)   Renal insufficiency   Relevant Orders   TSH (Completed)   CBC (Completed)   Comprehensive metabolic panel (Completed)   Lipid panel (Completed)   VITAMIN D 25 Hydroxy (Vit-D Deficiency, Fractures) (Completed)   Hemoglobin A1c (Completed)   Vitamin B12 (Completed)   Urinalysis   Urine culture   Skin lesion    Referred to dermatology for consideration      Relevant Orders   Ambulatory referral to Dermatology    Other Visit Diagnoses    Encounter for immunization    -  Primary    Need for vaccination with 13-polyvalent pneumococcal conjugate vaccine        Relevant Orders    Pneumococcal conjugate vaccine 13-valent (Completed)       I have discontinued Ms. Sao's potassium chloride. I  have also changed her KLOR-CON M10 to potassium chloride. Additionally, I am having her maintain her aspirin, calcium carbonate, cholecalciferol, Cyanocobalamin, mirtazapine, NAMENDA XR, simvastatin, levothyroxine, furosemide, and donepezil.  Meds ordered this encounter  Medications  . simvastatin (ZOCOR) 10 MG tablet    Sig: TAKE 1 TABLET (10 MG TOTAL) BY MOUTH DAILY.    Dispense:  90 tablet    Refill:  1    D/C PREVIOUS SCRIPTS FOR THIS MEDICATION  . levothyroxine (SYNTHROID, LEVOTHROID) 50 MCG tablet    Sig: TAKE 1 TABLET (50 MCG TOTAL) BY MOUTH DAILY.    Dispense:  30 tablet    Refill:  5  . potassium chloride (KLOR-CON M10) 10 MEQ tablet    Sig: Take 1 tablet (10 mEq total) by mouth daily.    Dispense:  90 tablet    Refill:  1  . furosemide (LASIX) 20 MG tablet    Sig: Take 1 tablet (20 mg total) by mouth daily.    Dispense:  90 tablet    Refill:  1    D/C PREVIOUS SCRIPTS FOR THIS MEDICATION  . donepezil (ARICEPT) 10 MG tablet    Sig:  Take 1 tablet (10 mg total) by mouth at bedtime.    Dispense:  30 tablet    Refill:  6    D/C PREVIOUS SCRIPTS FOR THIS MEDICATION     Penni Homans, MD

## 2015-11-24 NOTE — Progress Notes (Signed)
Pre visit review using our clinic review tool, if applicable. No additional management support is needed unless otherwise documented below in the visit note. 

## 2015-11-24 NOTE — Patient Instructions (Signed)
[employer] does hearing tests Preventive Care for Adults, Female A healthy lifestyle and preventive care can promote health and wellness. Preventive health guidelines for women include the following key practices.  A routine yearly physical is a good way to check with your health care provider about your health and preventive screening. It is a chance to share any concerns and updates on your health and to receive a thorough exam.  Visit your dentist for a routine exam and preventive care every 6 months. Brush your teeth twice a day and floss once a day. Good oral hygiene prevents tooth decay and gum disease.  The frequency of eye exams is based on your age, health, family medical history, use of contact lenses, and other factors. Follow your health care provider's recommendations for frequency of eye exams.  Eat a healthy diet. Foods like vegetables, fruits, whole grains, low-fat dairy products, and lean protein foods contain the nutrients you need without too many calories. Decrease your intake of foods high in solid fats, added sugars, and salt. Eat the right amount of calories for you.Get information about a proper diet from your health care provider, if necessary.  Regular physical exercise is one of the most important things you can do for your health. Most adults should get at least 150 minutes of moderate-intensity exercise (any activity that increases your heart rate and causes you to sweat) each week. In addition, most adults need muscle-strengthening exercises on 2 or more days a week.  Maintain a healthy weight. The body mass index (BMI) is a screening tool to identify possible weight problems. It provides an estimate of body fat based on height and weight. Your health care provider can find your BMI and can help you achieve or maintain a healthy weight.For adults 20 years and older:  A BMI below 18.5 is considered underweight.  A BMI of 18.5 to 24.9 is normal.  A BMI of 25 to 29.9 is  considered overweight.  A BMI of 30 and above is considered obese.  Maintain normal blood lipids and cholesterol levels by exercising and minimizing your intake of saturated fat. Eat a balanced diet with plenty of fruit and vegetables. Blood tests for lipids and cholesterol should begin at age 55 and be repeated every 5 years. If your lipid or cholesterol levels are high, you are over 50, or you are at high risk for heart disease, you may need your cholesterol levels checked more frequently.Ongoing high lipid and cholesterol levels should be treated with medicines if diet and exercise are not working.  If you smoke, find out from your health care provider how to quit. If you do not use tobacco, do not start.  Lung cancer screening is recommended for adults aged 50-80 years who are at high risk for developing lung cancer because of a history of smoking. A yearly low-dose CT scan of the lungs is recommended for people who have at least a 30-pack-year history of smoking and are a current smoker or have quit within the past 15 years. A pack year of smoking is smoking an average of 1 pack of cigarettes a day for 1 year (for example: 1 pack a day for 30 years or 2 packs a day for 15 years). Yearly screening should continue until the smoker has stopped smoking for at least 15 years. Yearly screening should be stopped for people who develop a health problem that would prevent them from having lung cancer treatment.  If you are pregnant, do not drink  alcohol. If you are breastfeeding, be very cautious about drinking alcohol. If you are not pregnant and choose to drink alcohol, do not have more than 1 drink per day. One drink is considered to be 12 ounces (355 mL) of beer, 5 ounces (148 mL) of wine, or 1.5 ounces (44 mL) of liquor.  Avoid use of street drugs. Do not share needles with anyone. Ask for help if you need support or instructions about stopping the use of drugs.  High blood pressure causes heart  disease and increases the risk of stroke. Your blood pressure should be checked at least every 1 to 2 years. Ongoing high blood pressure should be treated with medicines if weight loss and exercise do not work.  If you are 75-48 years old, ask your health care provider if you should take aspirin to prevent strokes.  Diabetes screening is done by taking a blood sample to check your blood glucose level after you have not eaten for a certain period of time (fasting). If you are not overweight and you do not have risk factors for diabetes, you should be screened once every 3 years starting at age 21. If you are overweight or obese and you are 67-59 years of age, you should be screened for diabetes every year as part of your cardiovascular risk assessment.  Breast cancer screening is essential preventive care for women. You should practice "breast self-awareness." This means understanding the normal appearance and feel of your breasts and may include breast self-examination. Any changes detected, no matter how small, should be reported to a health care provider. Women in their 71s and 30s should have a clinical breast exam (CBE) by a health care provider as part of a regular health exam every 1 to 3 years. After age 52, women should have a CBE every year. Starting at age 97, women should consider having a mammogram (breast X-ray test) every year. Women who have a family history of breast cancer should talk to their health care provider about genetic screening. Women at a high risk of breast cancer should talk to their health care providers about having an MRI and a mammogram every year.  Breast cancer gene (BRCA)-related cancer risk assessment is recommended for women who have family members with BRCA-related cancers. BRCA-related cancers include breast, ovarian, tubal, and peritoneal cancers. Having family members with these cancers may be associated with an increased risk for harmful changes (mutations) in the  breast cancer genes BRCA1 and BRCA2. Results of the assessment will determine the need for genetic counseling and BRCA1 and BRCA2 testing.  Your health care provider may recommend that you be screened regularly for cancer of the pelvic organs (ovaries, uterus, and vagina). This screening involves a pelvic examination, including checking for microscopic changes to the surface of your cervix (Pap test). You may be encouraged to have this screening done every 3 years, beginning at age 45.  For women ages 62-65, health care providers may recommend pelvic exams and Pap testing every 3 years, or they may recommend the Pap and pelvic exam, combined with testing for human papilloma virus (HPV), every 5 years. Some types of HPV increase your risk of cervical cancer. Testing for HPV may also be done on women of any age with unclear Pap test results.  Other health care providers may not recommend any screening for nonpregnant women who are considered low risk for pelvic cancer and who do not have symptoms. Ask your health care provider if a screening pelvic  exam is right for you.  If you have had past treatment for cervical cancer or a condition that could lead to cancer, you need Pap tests and screening for cancer for at least 20 years after your treatment. If Pap tests have been discontinued, your risk factors (such as having a new sexual partner) need to be reassessed to determine if screening should resume. Some women have medical problems that increase the chance of getting cervical cancer. In these cases, your health care provider may recommend more frequent screening and Pap tests.  Colorectal cancer can be detected and often prevented. Most routine colorectal cancer screening begins at the age of 27 years and continues through age 54 years. However, your health care provider may recommend screening at an earlier age if you have risk factors for colon cancer. On a yearly basis, your health care provider may  provide home test kits to check for hidden blood in the stool. Use of a small camera at the end of a tube, to directly examine the colon (sigmoidoscopy or colonoscopy), can detect the earliest forms of colorectal cancer. Talk to your health care provider about this at age 65, when routine screening begins. Direct exam of the colon should be repeated every 5-10 years through age 25 years, unless early forms of precancerous polyps or small growths are found.  People who are at an increased risk for hepatitis B should be screened for this virus. You are considered at high risk for hepatitis B if:  You were born in a country where hepatitis B occurs often. Talk with your health care provider about which countries are considered high risk.  Your parents were born in a high-risk country and you have not received a shot to protect against hepatitis B (hepatitis B vaccine).  You have HIV or AIDS.  You use needles to inject street drugs.  You live with, or have sex with, someone who has hepatitis B.  You get hemodialysis treatment.  You take certain medicines for conditions like cancer, organ transplantation, and autoimmune conditions.  Hepatitis C blood testing is recommended for all people born from [birth year] through 1965 and any individual with known risks for hepatitis C.  Practice safe sex. Use condoms and avoid high-risk sexual practices to reduce the spread of sexually transmitted infections (STIs). STIs include gonorrhea, chlamydia, syphilis, trichomonas, herpes, HPV, and human immunodeficiency virus (HIV). Herpes, HIV, and HPV are viral illnesses that have no cure. They can result in disability, cancer, and death.  You should be screened for sexually transmitted illnesses (STIs) including gonorrhea and chlamydia if:  You are sexually active and are younger than 24 years.  You are older than 24 years and your health care provider tells you that you are at risk for this type of infection.  Your  sexual activity has changed since you were last screened and you are at an increased risk for chlamydia or gonorrhea. Ask your health care provider if you are at risk.  If you are at risk of being infected with HIV, it is recommended that you take a prescription medicine daily to prevent HIV infection. This is called preexposure prophylaxis (PrEP). You are considered at risk if:  You are sexually active and do not regularly use condoms or know the HIV status of your partner(s).  You take drugs by injection.  You are sexually active with a partner who has HIV.  Talk with your health care provider about whether you are at high risk of being  infected with HIV. If you choose to begin PrEP, you should first be tested for HIV. You should then be tested every 3 months for as long as you are taking PrEP.  Osteoporosis is a disease in which the bones lose minerals and strength with aging. This can result in serious bone fractures or breaks. The risk of osteoporosis can be identified using a bone density scan. Women ages 49 years and over and women at risk for fractures or osteoporosis should discuss screening with their health care providers. Ask your health care provider whether you should take a calcium supplement or vitamin D to reduce the rate of osteoporosis.  Menopause can be associated with physical symptoms and risks. Hormone replacement therapy is available to decrease symptoms and risks. You should talk to your health care provider about whether hormone replacement therapy is right for you.  Use sunscreen. Apply sunscreen liberally and repeatedly throughout the day. You should seek shade when your shadow is shorter than you. Protect yourself by wearing long sleeves, pants, a wide-brimmed hat, and sunglasses year round, whenever you are outdoors.  Once a month, do a whole body skin exam, using a mirror to look at the skin on your back. Tell your health care provider of new moles, moles that have  irregular borders, moles that are larger than a pencil eraser, or moles that have changed in shape or color.  Stay current with required vaccines (immunizations).  Influenza vaccine. All adults should be immunized every year.  Tetanus, diphtheria, and acellular pertussis (Td, Tdap) vaccine. Pregnant women should receive 1 dose of Tdap vaccine during each pregnancy. The dose should be obtained regardless of the length of time since the last dose. Immunization is preferred during the 27th-36th week of gestation. An adult who has not previously received Tdap or who does not know her vaccine status should receive 1 dose of Tdap. This initial dose should be followed by tetanus and diphtheria toxoids (Td) booster doses every 10 years. Adults with an unknown or incomplete history of completing a 3-dose immunization series with Td-containing vaccines should begin or complete a primary immunization series including a Tdap dose. Adults should receive a Td booster every 10 years.  Varicella vaccine. An adult without evidence of immunity to varicella should receive 2 doses or a second dose if she has previously received 1 dose. Pregnant females who do not have evidence of immunity should receive the first dose after pregnancy. This first dose should be obtained before leaving the health care facility. The second dose should be obtained 4-8 weeks after the first dose.  Human papillomavirus (HPV) vaccine. Females aged 13-26 years who have not received the vaccine previously should obtain the 3-dose series. The vaccine is not recommended for use in pregnant females. However, pregnancy testing is not needed before receiving a dose. If a female is found to be pregnant after receiving a dose, no treatment is needed. In that case, the remaining doses should be delayed until after the pregnancy. Immunization is recommended for any person with an immunocompromised condition through the age of 28 years if she did not get any or  all doses earlier. During the 3-dose series, the second dose should be obtained 4-8 weeks after the first dose. The third dose should be obtained 24 weeks after the first dose and 16 weeks after the second dose.  Zoster vaccine. One dose is recommended for adults aged 68 years or older unless certain conditions are present.  Measles, mumps, and rubella (  MMR) vaccine. Adults born before [birth year] generally are considered immune to measles and mumps. Adults born in [birth year] or later should have 1 or more doses of MMR vaccine unless there is a contraindication to the vaccine or there is laboratory evidence of immunity to each of the three diseases. A routine second dose of MMR vaccine should be obtained at least 28 days after the first dose for students attending postsecondary schools, health care workers, or international travelers. People who received inactivated measles vaccine or an unknown type of measles vaccine during 1963-1967 should receive 2 doses of MMR vaccine. People who received inactivated mumps vaccine or an unknown type of mumps vaccine before 1979 and are at high risk for mumps infection should consider immunization with 2 doses of MMR vaccine. For females of childbearing age, rubella immunity should be determined. If there is no evidence of immunity, females who are not pregnant should be vaccinated. If there is no evidence of immunity, females who are pregnant should delay immunization until after pregnancy. Unvaccinated health care workers born before [birth year] who lack laboratory evidence of measles, mumps, or rubella immunity or laboratory confirmation of disease should consider measles and mumps immunization with 2 doses of MMR vaccine or rubella immunization with 1 dose of MMR vaccine.  Pneumococcal 13-valent conjugate (PCV13) vaccine. When indicated, a person who is uncertain of his immunization history and has no record of immunization should receive the PCV13 vaccine. All adults 81 years of age and  older should receive this vaccine. An adult aged 66 years or older who has certain medical conditions and has not been previously immunized should receive 1 dose of PCV13 vaccine. This PCV13 should be followed with a dose of pneumococcal polysaccharide (PPSV23) vaccine. Adults who are at high risk for pneumococcal disease should obtain the PPSV23 vaccine at least 8 weeks after the dose of PCV13 vaccine. Adults older than 79 years of age who have normal immune system function should obtain the PPSV23 vaccine dose at least 1 year after the dose of PCV13 vaccine.  Pneumococcal polysaccharide (PPSV23) vaccine. When PCV13 is also indicated, PCV13 should be obtained first. All adults aged 46 years and older should be immunized. An adult younger than age 42 years who has certain medical conditions should be immunized. Any person who resides in a nursing home or long-term care facility should be immunized. An adult smoker should be immunized. People with an immunocompromised condition and certain other conditions should receive both PCV13 and PPSV23 vaccines. People with human immunodeficiency virus (HIV) infection should be immunized as soon as possible after diagnosis. Immunization during chemotherapy or radiation therapy should be avoided. Routine use of PPSV23 vaccine is not recommended for American Indians, [place] Natives, or people younger than 65 years unless there are medical conditions that require PPSV23 vaccine. When indicated, people who have unknown immunization and have no record of immunization should receive PPSV23 vaccine. One-time revaccination 5 years after the first dose of PPSV23 is recommended for people aged 19-64 years who have chronic kidney failure, nephrotic syndrome, asplenia, or immunocompromised conditions. People who received 1-2 doses of PPSV23 before age 30 years should receive another dose of PPSV23 vaccine at age 26 years or later if at least 5 years have passed since the previous dose.  Doses of PPSV23 are not needed for people immunized with PPSV23 at or after age 74 years.  Meningococcal vaccine. Adults with asplenia or persistent complement component deficiencies should receive 2 doses of quadrivalent meningococcal conjugate (MenACWY-D) vaccine. The  doses should be obtained at least 2 months apart. Microbiologists working with certain meningococcal bacteria, [place] recruits, people at risk during an outbreak, and people who travel to or live in countries with a high rate of meningitis should be immunized. A first-year college student up through age 42 years who is living in a residence hall should receive a dose if she did not receive a dose on or after her 16th birthday. Adults who have certain high-risk conditions should receive one or more doses of vaccine.  Hepatitis A vaccine. Adults who wish to be protected from this disease, have certain high-risk conditions, work with hepatitis A-infected animals, work in hepatitis A research labs, or travel to or work in countries with a high rate of hepatitis A should be immunized. Adults who were previously unvaccinated and who anticipate close contact with an international adoptee during the first 60 days after arrival in the [place] from a country with a high rate of hepatitis A should be immunized.  Hepatitis B vaccine. Adults who wish to be protected from this disease, have certain high-risk conditions, may be exposed to blood or other infectious body fluids, are household contacts or sex partners of hepatitis B positive people, are clients or workers in certain care facilities, or travel to or work in countries with a high rate of hepatitis B should be immunized.  Haemophilus influenzae type b (Hib) vaccine. A previously unvaccinated person with asplenia or sickle cell disease or having a scheduled splenectomy should receive 1 dose of Hib vaccine. Regardless of previous immunization, a recipient of a hematopoietic stem cell  transplant should receive a 3-dose series 6-12 months after her successful transplant. Hib vaccine is not recommended for adults with HIV infection. Preventive Services / Frequency Ages 41 to 10 years  Blood pressure check.** / Every 3-5 years.  Lipid and cholesterol check.** / Every 5 years beginning at age 70.  Clinical breast exam.** / Every 3 years for women in their 58s and 30s.  BRCA-related cancer risk assessment.** / For women who have family members with a BRCA-related cancer (breast, ovarian, tubal, or peritoneal cancers).  Pap test.** / Every 2 years from ages 72 through 35. Every 3 years starting at age 80 through age 31 or 42 with a history of 3 consecutive normal Pap tests.  HPV screening.** / Every 3 years from ages 76 through ages 58 to 66 with a history of 3 consecutive normal Pap tests.  Hepatitis C blood test.** / For any individual with known risks for hepatitis C.  Skin self-exam. / Monthly.  Influenza vaccine. / Every year.  Tetanus, diphtheria, and acellular pertussis (Tdap, Td) vaccine.** / Consult your health care provider. Pregnant women should receive 1 dose of Tdap vaccine during each pregnancy. 1 dose of Td every 10 years.  Varicella vaccine.** / Consult your health care provider. Pregnant females who do not have evidence of immunity should receive the first dose after pregnancy.  HPV vaccine. / 3 doses over 6 months, if 64 and younger. The vaccine is not recommended for use in pregnant females. However, pregnancy testing is not needed before receiving a dose.  Measles, mumps, rubella (MMR) vaccine.** / You need at least 1 dose of MMR if you were born in 1957 or later. You may also need a 2nd dose. For females of childbearing age, rubella immunity should be determined. If there is no evidence of immunity, females who are not pregnant should be vaccinated. If there is no evidence of  immunity, females who are pregnant should delay immunization until after  pregnancy.  Pneumococcal 13-valent conjugate (PCV13) vaccine.** / Consult your health care provider.  Pneumococcal polysaccharide (PPSV23) vaccine.** / 1 to 2 doses if you smoke cigarettes or if you have certain conditions.  Meningococcal vaccine.** / 1 dose if you are age 72 to 9 years and a Market researcher living in a residence hall, or have one of several medical conditions, you need to get vaccinated against meningococcal disease. You may also need additional booster doses.  Hepatitis A vaccine.** / Consult your health care provider.  Hepatitis B vaccine.** / Consult your health care provider.  Haemophilus influenzae type b (Hib) vaccine.** / Consult your health care provider. Ages 76 to 47 years  Blood pressure check.** / Every year.  Lipid and cholesterol check.** / Every 5 years beginning at age 2 years.  Lung cancer screening. / Every year if you are aged 53-80 years and have a 30-pack-year history of smoking and currently smoke or have quit within the past 15 years. Yearly screening is stopped once you have quit smoking for at least 15 years or develop a health problem that would prevent you from having lung cancer treatment.  Clinical breast exam.** / Every year after age 32 years.  BRCA-related cancer risk assessment.** / For women who have family members with a BRCA-related cancer (breast, ovarian, tubal, or peritoneal cancers).  Mammogram.** / Every year beginning at age 44 years and continuing for as long as you are in good health. Consult with your health care provider.  Pap test.** / Every 3 years starting at age 40 years through age 85 or 66 years with a history of 3 consecutive normal Pap tests.  HPV screening.** / Every 3 years from ages 36 years through ages 67 to 60 years with a history of 3 consecutive normal Pap tests.  Fecal occult blood test (FOBT) of stool. / Every year beginning at age 73 years and continuing until age 46 years. You may not need  to do this test if you get a colonoscopy every 10 years.  Flexible sigmoidoscopy or colonoscopy.** / Every 5 years for a flexible sigmoidoscopy or every 10 years for a colonoscopy beginning at age 14 years and continuing until age 64 years.  Hepatitis C blood test.** / For all people born from [birth year] through 1963 and any individual with known risks for hepatitis C.  Skin self-exam. / Monthly.  Influenza vaccine. / Every year.  Tetanus, diphtheria, and acellular pertussis (Tdap/Td) vaccine.** / Consult your health care provider. Pregnant women should receive 1 dose of Tdap vaccine during each pregnancy. 1 dose of Td every 10 years.  Varicella vaccine.** / Consult your health care provider. Pregnant females who do not have evidence of immunity should receive the first dose after pregnancy.  Zoster vaccine.** / 1 dose for adults aged 23 years or older.  Measles, mumps, rubella (MMR) vaccine.** / You need at least 1 dose of MMR if you were born in 1957 or later. You may also need a second dose. For females of childbearing age, rubella immunity should be determined. If there is no evidence of immunity, females who are not pregnant should be vaccinated. If there is no evidence of immunity, females who are pregnant should delay immunization until after pregnancy.  Pneumococcal 13-valent conjugate (PCV13) vaccine.** / Consult your health care provider.  Pneumococcal polysaccharide (PPSV23) vaccine.** / 1 to 2 doses if you smoke cigarettes or if you have certain conditions.  Meningococcal vaccine.** / Consult your health care provider.  Hepatitis A vaccine.** / Consult your health care provider.  Hepatitis B vaccine.** / Consult your health care provider.  Haemophilus influenzae type b (Hib) vaccine.** / Consult your health care provider. Ages 54 years and over  Blood pressure check.** / Every year.  Lipid and cholesterol check.** / Every 5 years beginning at age 87 years.  Lung cancer  screening. / Every year if you are aged 71-80 years and have a 30-pack-year history of smoking and currently smoke or have quit within the past 15 years. Yearly screening is stopped once you have quit smoking for at least 15 years or develop a health problem that would prevent you from having lung cancer treatment.  Clinical breast exam.** / Every year after age 68 years.  BRCA-related cancer risk assessment.** / For women who have family members with a BRCA-related cancer (breast, ovarian, tubal, or peritoneal cancers).  Mammogram.** / Every year beginning at age 93 years and continuing for as long as you are in good health. Consult with your health care provider.  Pap test.** / Every 3 years starting at age 76 years through age 45 or 58 years with 3 consecutive normal Pap tests. Testing can be stopped between 65 and 70 years with 3 consecutive normal Pap tests and no abnormal Pap or HPV tests in the past 10 years.  HPV screening.** / Every 3 years from ages 37 years through ages 35 or 59 years with a history of 3 consecutive normal Pap tests. Testing can be stopped between 65 and 70 years with 3 consecutive normal Pap tests and no abnormal Pap or HPV tests in the past 10 years.  Fecal occult blood test (FOBT) of stool. / Every year beginning at age 5 years and continuing until age 66 years. You may not need to do this test if you get a colonoscopy every 10 years.  Flexible sigmoidoscopy or colonoscopy.** / Every 5 years for a flexible sigmoidoscopy or every 10 years for a colonoscopy beginning at age 49 years and continuing until age 81 years.  Hepatitis C blood test.** / For all people born from [birth year] through 1963 and any individual with known risks for hepatitis C.  Osteoporosis screening.** / A one-time screening for women ages 28 years and over and women at risk for fractures or osteoporosis.  Skin self-exam. / Monthly.  Influenza vaccine. / Every year.  Tetanus, diphtheria, and  acellular pertussis (Tdap/Td) vaccine.** / 1 dose of Td every 10 years.  Varicella vaccine.** / Consult your health care provider.  Zoster vaccine.** / 1 dose for adults aged 36 years or older.  Pneumococcal 13-valent conjugate (PCV13) vaccine.** / Consult your health care provider.  Pneumococcal polysaccharide (PPSV23) vaccine.** / 1 dose for all adults aged 39 years and older.  Meningococcal vaccine.** / Consult your health care provider.  Hepatitis A vaccine.** / Consult your health care provider.  Hepatitis B vaccine.** / Consult your health care provider.  Haemophilus influenzae type b (Hib) vaccine.** / Consult your health care provider. ** Family history and personal history of risk and conditions may change your health care provider's recommendations.   This information is not intended to replace advice given to you by your health care provider. Make sure you discuss any questions you have with your health care provider.   Document Released: 02/05/2002 Document Revised: 12/31/2014 Document Reviewed: 05/07/2011 Elsevier Interactive Patient Education [employer].

## 2015-11-27 DIAGNOSIS — L989 Disorder of the skin and subcutaneous tissue, unspecified: Secondary | ICD-10-CM | POA: Insufficient documentation

## 2015-11-27 NOTE — Assessment & Plan Note (Addendum)
Patient denies any difficulties at home. No trouble with ADLs, depression or falls. See EMR for functional status screen and depression screen. No recent changes to vision or hearing. Is UTD with immunizations. Is UTD with screening. Discussed Advanced Directives. Encouraged heart healthy diet, exercise as tolerated and adequate sleep. See patient's problem list for health risk factors to monitor. See AVS for preventative healthcare recommendation schedule. Has aged out of screening MGM, paps, colonoscopies. Not a candidate for Zostavax but is given a Prevnar shot today Labs ordered and reviewed.

## 2015-11-27 NOTE — Assessment & Plan Note (Signed)
On Levothyroxine, continue to monitor 

## 2015-11-27 NOTE — Assessment & Plan Note (Signed)
Referred to dermatology for consideration 

## 2015-11-27 NOTE — Assessment & Plan Note (Signed)
Tolerating statin, encouraged heart healthy diet, avoid trans fats, minimize simple carbs and saturated fats. Increase exercise as tolerated 

## 2015-11-27 NOTE — Assessment & Plan Note (Signed)
hgba1c acceptable, minimize simple carbs. Increase exercise as tolerated.  

## 2015-11-27 NOTE — Assessment & Plan Note (Signed)
Tolerating meds, symptoms slowly progressing but doing well at home with daughter.

## 2015-11-27 NOTE — Assessment & Plan Note (Signed)
Over supplemented will drop dosing to every other day.

## 2015-12-03 ENCOUNTER — Other Ambulatory Visit: Payer: Self-pay | Admitting: Family Medicine

## 2015-12-10 ENCOUNTER — Other Ambulatory Visit: Payer: Self-pay | Admitting: Family Medicine

## 2016-05-12 ENCOUNTER — Other Ambulatory Visit: Payer: Self-pay | Admitting: Family Medicine

## 2016-05-24 ENCOUNTER — Encounter: Payer: Self-pay | Admitting: Family Medicine

## 2016-05-24 ENCOUNTER — Ambulatory Visit (INDEPENDENT_AMBULATORY_CARE_PROVIDER_SITE_OTHER): Payer: Medicare Other | Admitting: Family Medicine

## 2016-05-24 VITALS — BP 108/72 | HR 67 | Temp 98.2°F | Ht 68.0 in | Wt 165.2 lb

## 2016-05-24 DIAGNOSIS — N289 Disorder of kidney and ureter, unspecified: Secondary | ICD-10-CM

## 2016-05-24 DIAGNOSIS — M810 Age-related osteoporosis without current pathological fracture: Secondary | ICD-10-CM | POA: Diagnosis not present

## 2016-05-24 DIAGNOSIS — E039 Hypothyroidism, unspecified: Secondary | ICD-10-CM

## 2016-05-24 DIAGNOSIS — E538 Deficiency of other specified B group vitamins: Secondary | ICD-10-CM

## 2016-05-24 DIAGNOSIS — F0280 Dementia in other diseases classified elsewhere without behavioral disturbance: Secondary | ICD-10-CM

## 2016-05-24 DIAGNOSIS — E785 Hyperlipidemia, unspecified: Secondary | ICD-10-CM

## 2016-05-24 DIAGNOSIS — G309 Alzheimer's disease, unspecified: Secondary | ICD-10-CM

## 2016-05-24 DIAGNOSIS — R739 Hyperglycemia, unspecified: Secondary | ICD-10-CM

## 2016-05-24 NOTE — Patient Instructions (Addendum)
Try warm milk and honey with vanilla and a carb such as animal crackers or saltines  NOW co probiotic 10 strain cap 1 daily [employer].com  Add Benefiber fiber twice daily  Insomnia Insomnia is a sleep disorder that makes it difficult to fall asleep or to stay asleep. Insomnia can cause tiredness (fatigue), low energy, difficulty concentrating, mood swings, and poor performance at work or school.  There are three different ways to classify insomnia:  Difficulty falling asleep.  Difficulty staying asleep.  Waking up too early in the morning. Any type of insomnia can be long-term (chronic) or short-term (acute). Both are common. Short-term insomnia usually lasts for three months or less. Chronic insomnia occurs at least three times a week for longer than three months. CAUSES  Insomnia may be caused by another condition, situation, or substance, such as:  Anxiety.  Certain medicines.  Gastroesophageal reflux disease (GERD) or other gastrointestinal conditions.  Asthma or other breathing conditions.  Restless legs syndrome, sleep apnea, or other sleep disorders.  Chronic pain.  Menopause. This may include hot flashes.  Stroke.  Abuse of alcohol, tobacco, or illegal drugs.  Depression.  Caffeine.   Neurological disorders, such as Alzheimer disease.  An overactive thyroid (hyperthyroidism). The cause of insomnia may not be known. RISK FACTORS Risk factors for insomnia include:  Gender. Women are more commonly affected than men.  Age. Insomnia is more common as you get older.  Stress. This may involve your professional or personal life.  Income. Insomnia is more common in people with lower income.  Lack of exercise.   Irregular work schedule or night shifts.  Traveling between different time zones. SIGNS AND SYMPTOMS If you have insomnia, trouble falling asleep or trouble staying asleep is the main symptom. This may lead to other symptoms, such  as:  Feeling fatigued.  Feeling nervous about going to sleep.  Not feeling rested in the morning.  Having trouble concentrating.  Feeling irritable, anxious, or depressed. TREATMENT  Treatment for insomnia depends on the cause. If your insomnia is caused by an underlying condition, treatment will focus on addressing the condition. Treatment may also include:   Medicines to help you sleep.  Counseling or therapy.  Lifestyle adjustments. HOME CARE INSTRUCTIONS   Take medicines only as directed by your health care provider.  Keep regular sleeping and waking hours. Avoid naps.  Keep a sleep diary to help you and your health care provider figure out what could be causing your insomnia. Include:   When you sleep.  When you wake up during the night.  How well you sleep.   How rested you feel the next day.  Any side effects of medicines you are taking.  What you eat and drink.   Make your bedroom a comfortable place where it is easy to fall asleep:  Put up shades or special blackout curtains to block light from outside.  Use a white noise machine to block noise.  Keep the temperature cool.   Exercise regularly as directed by your health care provider. Avoid exercising right before bedtime.  Use relaxation techniques to manage stress. Ask your health care provider to suggest some techniques that may work well for you. These may include:  Breathing exercises.  Routines to release muscle tension.  Visualizing peaceful scenes.  Cut back on alcohol, caffeinated beverages, and cigarettes, especially close to bedtime. These can disrupt your sleep.  Do not overeat or eat spicy foods right before bedtime. This can lead to digestive discomfort  that can make it hard for you to sleep.  Limit screen use before bedtime. This includes:  Watching TV.  Using your smartphone, tablet, and computer.  Stick to a routine. This can help you fall asleep faster. Try to do a  quiet activity, brush your teeth, and go to bed at the same time each night.  Get out of bed if you are still awake after 15 minutes of trying to sleep. Keep the lights down, but try reading or doing a quiet activity. When you feel sleepy, go back to bed.  Make sure that you drive carefully. Avoid driving if you feel very sleepy.  Keep all follow-up appointments as directed by your health care provider. This is important. SEEK MEDICAL CARE IF:   You are tired throughout the day or have trouble in your daily routine due to sleepiness.  You continue to have sleep problems or your sleep problems get worse. SEEK IMMEDIATE MEDICAL CARE IF:   You have serious thoughts about hurting yourself or someone else.   This information is not intended to replace advice given to you by your health care provider. Make sure you discuss any questions you have with your health care provider.   Document Released: 12/07/2000 Document Revised: 08/31/2015 Document Reviewed: 09/10/2014 Elsevier Interactive Patient Education [employer].

## 2016-05-24 NOTE — Progress Notes (Signed)
Pre visit review using our clinic review tool, if applicable. No additional management support is needed unless otherwise documented below in the visit note. 

## 2016-05-27 NOTE — Assessment & Plan Note (Signed)
On Levothyroxine, continue to monitor 

## 2016-05-27 NOTE — Assessment & Plan Note (Signed)
Tolerating statin, encouraged heart healthy diet, avoid trans fats, minimize simple carbs and saturated fats. Increase exercise as tolerated 

## 2016-05-27 NOTE — Assessment & Plan Note (Signed)
Continues to be cared for at home by her daughter and family. She is doing well and has not had any major behavioral concerns.

## 2016-05-27 NOTE — Progress Notes (Signed)
Patient ID: Carrie Stephens, female   DOB: 1920-06-12, 82 y.o.   MRN: [id number]   Subjective:    Patient ID: Carrie Stephens, female    DOB: [birth year], 7 y.o.   MRN: [id number]  Chief Complaint  Patient presents with  . Follow-up    HPI Patient is in today for follow up. She is brought in by her daughter who is her care provider. She is doing well at home she continues to eat well and is able to do a great deal of her self care with direction still. Is struggling with sleep sometimes. Denies CP/palp/SOB/HA/congestion/fevers/GI or GU c/o. Taking meds as prescribed  Past Medical History  Diagnosis Date  . S/P lumpectomy of breast 01/2006    and sentinel node dissection   . Radiation 04/2006    s/p external radiation -Arimidex since 04/12/2006  . Alzheimer disease     moderate to severe  . Hyperlipidemia   . Depression   . History of UTI   . Vision loss, left eye     secondary to retinal hemorrhage, unclear etiology  . Osteoporosis     s/p zometa  3.3 mg IV 05/2007  . Thyroid nodule   . 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 Renal insufficiency 11/15/2013  . Hyperglycemia 11/15/2013  . Medicare annual wellness visit, subsequent 10/17/2014  . Macular degeneration 11/24/2015    Past Surgical History  Procedure Laterality Date  . Vaginal hysterectomy  1960's    status post  . Lobectomy  08/2009    thyroid right-mixed and macrofollicular adenoma, 8.5 cm, malignant features are not identified    Family History  Problem Relation Age of Onset  . Breast cancer Mother   . Diabetes Father     adult onset type 2  . Other Neg Hx     No report of heart disease or stroke in the family    Social History   Social History  . Marital Status: Widowed    Spouse Name: N/A  . Number of Children: 3  . Years of Education: N/A   Occupational History    . Retired     [employer]   Social History Main Topics  . Smoking status: Former Research scientist (life sciences)  . Smokeless tobacco: Never Used     Comment: light smoker- smoked 3 or 4 cigarettes a day for 55 years  . Alcohol Use: Not on file  . Drug Use: Not on file  . Sexual Activity: Not on file   Other Topics Concern  . 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  She has a total of 3 children, two daughters and one son.     No alcohol.  She quit tobacco approximately 2 years ago.  She   was a light smoker, smoked 3 or 4 cigarettes a day for 55 years.      Outpatient Prescriptions Prior to Visit  Medication Sig Dispense Refill  . aspirin 81 MG tablet Take 81 mg by mouth daily.      . calcium carbonate (OS-CAL) 600 MG TABS Take 600 mg by mouth 2 (two) times daily with a meal.      . Cholecalciferol (VITAMIN D3) 1000 UNITS tablet Take 1,000  Units by mouth daily.      . Cyanocobalamin 1000 MCG CAPS Take by mouth.    . donepezil (ARICEPT) 10 MG tablet Take 1 tablet (10 mg total) by mouth at bedtime. 30 tablet 6  . furosemide (LASIX) 20 MG tablet Take 1 tablet (20 mg total) by mouth daily. 90 tablet 1  . levothyroxine (SYNTHROID, LEVOTHROID) 50 MCG tablet TAKE 1 TABLET (50 MCG TOTAL) BY MOUTH DAILY. 30 tablet 5  . mirtazapine (REMERON) 15 MG tablet TAKE 1 TABLET (15 MG TOTAL) BY MOUTH AT BEDTIME. 90 tablet 1  . NAMENDA XR 28 MG CP24 24 hr capsule TAKE 1 CAPSULE (28 MG TOTAL) BY MOUTH DAILY. 30 capsule 6  . potassium chloride (KLOR-CON M10) 10 MEQ tablet Take 1 tablet (10 mEq total) by mouth daily. 90 tablet 1  . simvastatin (ZOCOR) 10 MG tablet TAKE 1 TABLET (10 MG TOTAL) BY MOUTH DAILY. 90 tablet 1  . mirtazapine (REMERON) 15 MG tablet TAKE 1 TABLET (15 MG TOTAL) BY MOUTH AT BEDTIME. 90 tablet 1  . mirtazapine (REMERON) 15 MG tablet TAKE 1 TABLET (15 MG TOTAL) BY MOUTH AT BEDTIME. 90 tablet 1   No  facility-administered medications prior to visit.    Allergies  Allergen Reactions  . Codeine Phosphate     REACTION: unspecified  . Ibuprofen     REACTION: unspecified  . Moxifloxacin     REACTION: rash    Review of Systems  Constitutional: Negative for fever and malaise/fatigue.  HENT: Negative for congestion.   Eyes: Negative for blurred vision.  Respiratory: Negative for shortness of breath.   Cardiovascular: Negative for chest pain, palpitations and leg swelling.  Gastrointestinal: Negative for nausea, abdominal pain and blood in stool.  Genitourinary: Negative for dysuria and frequency.  Musculoskeletal: Negative for falls.  Skin: Negative for rash.  Neurological: Negative for dizziness, loss of consciousness and headaches.  Endo/Heme/Allergies: Negative for environmental allergies.  Psychiatric/Behavioral: Positive for memory loss. Negative for depression. The patient has insomnia. The patient is not nervous/anxious.        Objective:    Physical Exam  Constitutional: She is oriented to person, place, and time. She appears well-developed and well-nourished. No distress.  HENT:  Head: Normocephalic and atraumatic.  Nose: Nose normal.  Eyes: Right eye exhibits no discharge. Left eye exhibits no discharge.  Neck: Normal range of motion. Neck supple.  Cardiovascular: Normal rate and regular rhythm.   Murmur heard. Pulmonary/Chest: Effort normal and breath sounds normal.  Abdominal: Soft. Bowel sounds are normal. There is no tenderness.  Musculoskeletal: She exhibits no edema.  Neurological: She is alert and oriented to person, place, and time.  Skin: Skin is warm and dry.  Psychiatric: She has a normal mood and affect.  Nursing note and vitals reviewed.   BP 108/72 mmHg  Pulse 67  Temp(Src) 98.2 F (36.8 C) (Oral)  Ht 5\' 8"  (1.727 m)  Wt 165 lb 4 oz (74.957 kg)  BMI 25.13 kg/m2  SpO2 96% Wt Readings from Last 3 Encounters:  05/24/16 165 lb 4 oz (74.957 kg)   11/24/15 166 lb 8 oz (75.524 kg)  04/28/15 163 lb 6 oz (74.106 kg)     Lab Results  Component Value Date   WBC 5.8 11/24/2015   HGB 13.4 11/24/2015   HCT 41.2 11/24/2015   PLT 234.0 11/24/2015   GLUCOSE 90 11/24/2015   CHOL 158 11/24/2015   TRIG 232.0* 11/24/2015   HDL 58.60 11/24/2015   LDLDIRECT 72.0  11/24/2015   LDLCALC 59 06/03/2014   ALT 8 11/24/2015   AST 17 11/24/2015   NA 145 11/24/2015   K 3.9 11/24/2015   CL 109 11/24/2015   CREATININE 1.22* 11/24/2015   BUN 20 11/24/2015   CO2 26 11/24/2015   TSH 1.35 11/24/2015   HGBA1C 5.7 11/24/2015    Lab Results  Component Value Date   TSH 1.35 11/24/2015   Lab Results  Component Value Date   WBC 5.8 11/24/2015   HGB 13.4 11/24/2015   HCT 41.2 11/24/2015   MCV 92.7 11/24/2015   PLT 234.0 11/24/2015   Lab Results  Component Value Date   NA 145 11/24/2015   K 3.9 11/24/2015   CO2 26 11/24/2015   GLUCOSE 90 11/24/2015   BUN 20 11/24/2015   CREATININE 1.22* 11/24/2015   BILITOT 0.7 11/24/2015   ALKPHOS 54 11/24/2015   AST 17 11/24/2015   ALT 8 11/24/2015   PROT 6.6 11/24/2015   ALBUMIN 3.9 11/24/2015   CALCIUM 9.2 11/24/2015   GFR 52.81* 11/24/2015   Lab Results  Component Value Date   CHOL 158 11/24/2015   Lab Results  Component Value Date   HDL 58.60 11/24/2015   Lab Results  Component Value Date   LDLCALC 59 06/03/2014   Lab Results  Component Value Date   TRIG 232.0* 11/24/2015   Lab Results  Component Value Date   CHOLHDL 3 11/24/2015   Lab Results  Component Value Date   HGBA1C 5.7 11/24/2015       Assessment & Plan:   Problem List Items Addressed This Visit    Renal insufficiency   Relevant Orders   Renal function panel   TSH   CBC   Lipid panel   Vitamin B12   Osteoporosis    Encouraged to get adequate exercise, calcium and vitamin d intake      Relevant Orders   Renal function panel   TSH   CBC   Lipid panel   Vitamin B12   Hypothyroidism - Primary    On  Levothyroxine, continue to monitor      Relevant Orders   Renal function panel   TSH   CBC   Lipid panel   Vitamin B12   Hyperlipidemia    Tolerating statin, encouraged heart healthy diet, avoid trans fats, minimize simple carbs and saturated fats. Increase exercise as tolerated      Relevant Orders   Renal function panel   TSH   CBC   Lipid panel   Vitamin B12   Hyperglycemia   Relevant Orders   Renal function panel   TSH   CBC   Lipid panel   Vitamin B12   ALZHEIMER'S DISEASE    Continues to be cared for at home by her daughter and family. She is doing well and has not had any major behavioral concerns.        Other Visit Diagnoses    Vitamin B 12 deficiency        Relevant Orders    Vitamin B12       I am having Ms. Schaben maintain her aspirin, calcium carbonate, cholecalciferol, Cyanocobalamin, NAMENDA XR, simvastatin, levothyroxine, potassium chloride, furosemide, donepezil, and mirtazapine.  No orders of the defined types were placed in this encounter.     Penni Homans, MD

## 2016-05-27 NOTE — Assessment & Plan Note (Signed)
Encouraged to get adequate exercise, calcium and vitamin d intake 

## 2016-06-03 ENCOUNTER — Other Ambulatory Visit: Payer: Self-pay | Admitting: Family Medicine

## 2016-06-18 ENCOUNTER — Other Ambulatory Visit: Payer: Self-pay | Admitting: Family Medicine

## 2016-07-05 ENCOUNTER — Other Ambulatory Visit: Payer: Self-pay | Admitting: Family Medicine

## 2016-07-10 ENCOUNTER — Other Ambulatory Visit: Payer: Self-pay | Admitting: *Deleted

## 2016-07-10 MED ORDER — DONEPEZIL HCL 10 MG PO TABS: 10.0000 mg | ORAL_TABLET | Freq: Every day | ORAL | Status: DC

## 2016-07-10 NOTE — Telephone Encounter (Signed)
Rx sent to the pharmacy by e-script.//AB/CMA 

## 2016-07-28 ENCOUNTER — Other Ambulatory Visit: Payer: Self-pay | Admitting: Family Medicine

## 2016-09-10 ENCOUNTER — Other Ambulatory Visit: Payer: Self-pay | Admitting: Family Medicine

## 2016-09-10 MED ORDER — LEVOTHYROXINE SODIUM 50 MCG PO TABS: 50.0000 ug | ORAL_TABLET | Freq: Every day | ORAL | 0 refills | Status: DC

## 2016-10-06 ENCOUNTER — Other Ambulatory Visit: Payer: Self-pay | Admitting: Family Medicine

## 2016-10-15 ENCOUNTER — Telehealth: Payer: Self-pay | Admitting: Family Medicine

## 2016-10-15 ENCOUNTER — Ambulatory Visit (INDEPENDENT_AMBULATORY_CARE_PROVIDER_SITE_OTHER): Payer: Medicare Other | Admitting: Medical

## 2016-10-15 ENCOUNTER — Ambulatory Visit (HOSPITAL_BASED_OUTPATIENT_CLINIC_OR_DEPARTMENT_OTHER)
Admission: RE | Admit: 2016-10-15 | Discharge: 2016-10-15 | Disposition: A | Discharge status: Home / Self Care | Payer: Medicare Other | Source: Ambulatory Visit | Attending: Medical | Admitting: Medical

## 2016-10-15 ENCOUNTER — Encounter: Payer: Self-pay | Admitting: Medical

## 2016-10-15 VITALS — BP 108/72 | HR 67 | Temp 99.5°F | Ht 68.0 in | Wt 160.2 lb

## 2016-10-15 DIAGNOSIS — R05 Cough: Secondary | ICD-10-CM | POA: Insufficient documentation

## 2016-10-15 DIAGNOSIS — R509 Fever, unspecified: Secondary | ICD-10-CM | POA: Insufficient documentation

## 2016-10-15 DIAGNOSIS — R059 Cough, unspecified: Secondary | ICD-10-CM

## 2016-10-15 LAB — CBC WITH DIFFERENTIAL/PLATELET
BASOS PCT: 0.3 % (ref 0.0–3.0)
Basophils Absolute: 0 10*3/uL (ref 0.0–0.1)
EOS ABS: 0 10*3/uL (ref 0.0–0.7)
EOS PCT: 0.3 % (ref 0.0–5.0)
HEMATOCRIT: 39.7 % (ref 36.0–46.0)
HEMOGLOBIN: 13.1 g/dL (ref 12.0–15.0)
LYMPHS PCT: 13.4 % (ref 12.0–46.0)
Lymphs Abs: 1.7 10*3/uL (ref 0.7–4.0)
MCHC: 33.1 g/dL (ref 30.0–36.0)
MCV: 92.4 fl (ref 78.0–100.0)
Monocytes Absolute: 1.3 10*3/uL — ABNORMAL HIGH (ref 0.1–1.0)
Monocytes Relative: 10.3 % (ref 3.0–12.0)
Neutro Abs: 9.6 10*3/uL — ABNORMAL HIGH (ref 1.4–7.7)
Neutrophils Relative %: 75.7 % (ref 43.0–77.0)
Platelets: 283 10*3/uL (ref 150.0–400.0)
RBC: 4.3 Mil/uL (ref 3.87–5.11)
RDW: 14.5 % (ref 11.5–15.5)
WBC: 12.7 10*3/uL — AB (ref 4.0–10.5)

## 2016-10-15 MED ORDER — AZITHROMYCIN 250 MG PO TABS: ORAL_TABLET | ORAL | 0 refills | Status: DC

## 2016-10-15 MED ORDER — DOXYCYCLINE HYCLATE 100 MG PO TABS: 100.0000 mg | ORAL_TABLET | Freq: Two times a day (BID) | ORAL | 0 refills | Status: DC

## 2016-10-15 MED ORDER — BENZONATATE 100 MG PO CAPS: ORAL_CAPSULE | ORAL | 0 refills | Status: DC

## 2016-10-15 NOTE — Patient Instructions (Addendum)
You appear to have bronchitis. Rest hydrate and tylenol for fever. I am prescribing cough medicine benzonatate, and doxycycline antibiotic.   Please dc cough syrup using now.  Will go ahead and get cxr and cbc today. Evaluate if has pneumonia.  Follow up in 7-10 days or as needed

## 2016-10-15 NOTE — Telephone Encounter (Signed)
[id number] [address], [place] [phone] (Phone) [phone] (Fax)   Pharmacy states [facility] interacts with another medication, please advise

## 2016-10-15 NOTE — Progress Notes (Signed)
Pre visit review using our clinic review tool, if applicable. No additional management support is needed unless otherwise documented below in the visit note./hsm  

## 2016-10-15 NOTE — Telephone Encounter (Signed)
I called pharmacy. It was issue with possible qt prolongation with aricept and azithromycin. So I changed her rx to doxycycline.

## 2016-10-15 NOTE — Telephone Encounter (Signed)
Caller name:Debra Delorse Lek Relation to [phone]  Call back number: [phone]    Reason for call:  Daughter requesting handicap sticker for patient, please advise

## 2016-10-15 NOTE — Telephone Encounter (Signed)
Ok to fill out handicap sticker form for permanent, for unable to ambulate greater than 200 feet

## 2016-10-15 NOTE — Telephone Encounter (Signed)
Please advise 

## 2016-10-15 NOTE — Progress Notes (Signed)
Subjective:    Patient ID: Carrie Stephens, female    DOB: 1920/12/12, 82 y.o.   MRN: [id number]  HPI  Pt in for recent illness since Thursday. Has had hoarse voice and some cough. Hoarseness for 2 days only. But since yesterday her cough has worsened with some mucous production. Some runny nose. Daughter thinks she has some low grade fever but did not check her temperature.  Pt daughter has some concern about pneumonia.  Past 8 years pneumonia maybe pneumonia 2 times.  Review of Systems  Constitutional: Negative for chills and fatigue.  HENT: Positive for congestion. Negative for mouth sores, nosebleeds, postnasal drip, rhinorrhea, sinus pressure and sneezing.   Respiratory: Positive for cough. Negative for choking, shortness of breath and wheezing.   Cardiovascular: Negative for chest pain and palpitations.  Gastrointestinal: Negative for abdominal pain.  Genitourinary: Negative for dysuria and frequency.  Musculoskeletal: Negative for back pain.  Skin: Negative for rash.  Neurological: Negative for dizziness and headaches.  Hematological: Negative for adenopathy. Does not bruise/bleed easily.  Psychiatric/Behavioral: Negative for behavioral problems.    Past Medical History:  Diagnosis Date  . Alzheimer disease    moderate to severe  . 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 Depression   . History of UTI   . Hyperglycemia 11/15/2013  . Hyperlipidemia   . Macular degeneration 11/24/2015  . Medicare annual wellness visit, subsequent 10/17/2014  . Osteoporosis    s/p zometa  3.3 mg IV 05/2007  . Radiation 04/2006   s/p external radiation -Arimidex since 04/12/2006  . Renal insufficiency 11/15/2013  . S/P lumpectomy of breast 01/2006   and sentinel node dissection   . Thyroid nodule   . Vision loss, left eye    secondary to retinal  hemorrhage, unclear etiology     Social History   Social History  . Marital status: Widowed    Spouse name: N/A  . Number of children: 3  . Years of education: N/A   Occupational History  . Retired Retired    Public relations account executive   Social History Main Topics  . Smoking status: Former Research scientist (life sciences)  . Smokeless tobacco: Never Used     Comment: light smoker- smoked 3 or 4 cigarettes a day for 55 years  . Alcohol use Not on file  . Drug use: Unknown  . Sexual activity: Not on file   Other Topics Concern  . 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  She has a total of 3 children, two daughters and one son.     No alcohol.  She quit tobacco approximately 2 years ago.  She   was a light smoker, smoked 3 or 4 cigarettes a day for 55 years.      Past Surgical History:  Procedure Laterality Date  . LOBECTOMY  08/2009   thyroid right-mixed and macrofollicular adenoma, 8.5 cm, malignant features are not identified  . VAGINAL HYSTERECTOMY  1960's   status post    Family History  Problem Relation Age of Onset  . Breast cancer Mother   . Diabetes Father     adult onset type 2  . Other Neg Hx     No report of heart disease or stroke  in the family    Allergies  Allergen Reactions  . Codeine Phosphate     REACTION: unspecified  . Ibuprofen     REACTION: unspecified  . Moxifloxacin     REACTION: rash    Current Outpatient Prescriptions on File Prior to Visit  Medication Sig Dispense Refill  . aspirin 81 MG tablet Take 81 mg by mouth daily.      . calcium carbonate (OS-CAL) 600 MG TABS Take 600 mg by mouth 2 (two) times daily with a meal.      . Cholecalciferol (VITAMIN D3) 1000 UNITS tablet Take 1,000 Units by mouth daily.      . Cyanocobalamin 1000 MCG CAPS Take by mouth.    . donepezil (ARICEPT) 10 MG tablet Take 1 tablet (10 mg total) by mouth at bedtime. 30 tablet 6  . furosemide  (LASIX) 20 MG tablet TAKE 1 TABLET (20 MG TOTAL) BY MOUTH DAILY. 90 tablet 0  . KLOR-CON M10 10 MEQ tablet TAKE 1 TABLET (10 MEQ TOTAL) BY MOUTH DAILY. 90 tablet 1  . levothyroxine (SYNTHROID, LEVOTHROID) 50 MCG tablet TAKE 1 TABLET (50 MCG TOTAL) BY MOUTH DAILY. 30 tablet 2  . mirtazapine (REMERON) 15 MG tablet TAKE 1 TABLET (15 MG TOTAL) BY MOUTH AT BEDTIME. 90 tablet 1  . NAMENDA XR 28 MG CP24 24 hr capsule TAKE 1 CAPSULE (28 MG TOTAL) BY MOUTH DAILY. 30 capsule 6  . simvastatin (ZOCOR) 10 MG tablet TAKE 1 TABLET (10 MG TOTAL) BY MOUTH DAILY. 90 tablet 1   No current facility-administered medications on file prior to visit.     BP 108/72 (BP Location: Left Arm, Patient Position: Sitting)   Pulse 67   Temp 99.5 F (37.5 C) (Oral)   Ht 5\' 8"  (1.727 m)   Wt 160 lb 3.2 oz (72.7 kg)   SpO2 96%   BMI 24.36 kg/m       Objective:   Physical Exam  General  Mental Status - Alert. General Appearance - Well groomed. Not in acute distress.  Skin Rashes- No Rashes.  HEENT Head- Normal. Ear Auditory Canal - Left- Normal. Right - Normal.Tympanic Membrane- Left- Normal. Right- Normal. Eye Sclera/Conjunctiva- Left- Normal. Right- Normal. Nose & Sinuses Nasal Mucosa- Left-  Boggy and Congested. Right-  Boggy and  Congested.Bilateral no  maxillary and  No frontal sinus pressure. Mouth & Throat Lips: Upper Lip- Normal: no dryness, cracking, pallor, cyanosis, or vesicular eruption. Lower Lip-Normal: no dryness, cracking, pallor, cyanosis or vesicular eruption. Buccal Mucosa- Bilateral- No Aphthous ulcers. Oropharynx- No Discharge or Erythema. Tonsils: Characteristics- Bilateral- No Erythema or Congestion. Size/Enlargement- Bilateral- No enlargement. Discharge- bilateral-None.  Neck Neck- Supple. No Masses.   Chest and Lung Exam Auscultation: Breath Sounds:- even and unlabored but mild shallow.  Cardiovascular Auscultation:Rythm- Regular, rate and rhythm. Murmurs & Other Heart  Sounds:Ausculatation of the heart reveal- No Murmurs.  Lymphatic Head & Neck General Head & Neck Lymphatics: Bilateral: Description- No Localized lymphadenopathy.       Assessment & Plan:  You appear to have bronchitis. Rest hydrate and tylenol for fever. I am prescribing cough medicine benzonatate, and doxycycline antibiotic.   Please dc cough syrup using now.  Will go ahead and get cxr and cbc today. Evaluate if has pneumonia.  Follow up in 7-10 days or as needed   Chriselda Leppert, Percell Miller, [employer]

## 2016-10-16 NOTE — Telephone Encounter (Signed)
Completed/called the daughter left message ok to pickup placard at the front desk.

## 2016-10-22 ENCOUNTER — Other Ambulatory Visit: Payer: Self-pay | Admitting: Family Medicine

## 2016-10-22 MED ORDER — FUROSEMIDE 20 MG PO TABS
20.0000 mg | ORAL_TABLET | Freq: Every day | ORAL | 0 refills | Status: DC
Start: 2016-10-22 — End: 2017-01-18

## 2016-10-26 ENCOUNTER — Other Ambulatory Visit: Payer: Self-pay | Admitting: Medical

## 2016-10-26 ENCOUNTER — Telehealth: Payer: Self-pay | Admitting: Family Medicine

## 2016-10-26 NOTE — Telephone Encounter (Signed)
Caller name: Neoma Laming Relationship to patient: Daughter Can be reached: [phone] Pharmacy:  [id number] - [place], [place] [phone] (Phone) [phone] (Fax)     Reason for call: Refill benzonatate (TESSALON) 100 MG capsule [id number] Patient saw Percell Miller last week and was given the medication. Daughter states patient is doing better, but she still has a mild cough that keeps her awake at night.

## 2016-10-27 NOTE — Telephone Encounter (Signed)
Pt has some residual cough from prior visit but reports feeling better otherwise per message I got. Cough keeping her up at night. I will refill the benzonate. Recommend just taking 1-2 tab at night to control cough. Recommend use 1 tab at night prior to sleep. If needed and not sleeping due to severe cough then can take 2.  But also recommend/ offer check up again in office if this cough lingers and not stopping.

## 2016-10-29 NOTE — Telephone Encounter (Signed)
Daughter states they picked medication up up on this past Saturday.

## 2016-11-12 ENCOUNTER — Ambulatory Visit (INDEPENDENT_AMBULATORY_CARE_PROVIDER_SITE_OTHER): Payer: Medicare Other | Admitting: Family

## 2016-11-12 ENCOUNTER — Encounter: Payer: Self-pay | Admitting: Family

## 2016-11-12 ENCOUNTER — Ambulatory Visit (HOSPITAL_BASED_OUTPATIENT_CLINIC_OR_DEPARTMENT_OTHER)
Admission: RE | Admit: 2016-11-12 | Discharge: 2016-11-12 | Disposition: A | Discharge status: Home / Self Care | Payer: Medicare Other | Source: Ambulatory Visit | Attending: Family | Admitting: Family

## 2016-11-12 VITALS — BP 107/50 | HR 68 | Temp 98.6°F | Resp 18 | Ht 68.0 in | Wt 167.2 lb

## 2016-11-12 DIAGNOSIS — R0781 Pleurodynia: Secondary | ICD-10-CM | POA: Diagnosis not present

## 2016-11-12 DIAGNOSIS — H6123 Impacted cerumen, bilateral: Secondary | ICD-10-CM

## 2016-11-12 NOTE — Patient Instructions (Addendum)
Please complete x ray of ribs on the first floor.  Continue tylenol as needed for pain.

## 2016-11-12 NOTE — Progress Notes (Signed)
Pre visit review using our clinic review tool, if applicable. No additional management support is needed unless otherwise documented below in the visit note. 

## 2016-11-12 NOTE — Progress Notes (Signed)
Subjective:    Patient ID: Carrie Stephens, female    DOB: 1920/08/21, 82 y.o.   MRN: [id number]  HPI  Ms.  Veness is a 82 year old female who presents today following a fall off of the commode on 11/03/2016. She reports that she slid and hit the left side of her back up against the toilet paper dispenser.  She has been complaining of pain in the lower back when she tries to get up out of the chair.  Daughter has been using an otc topical "tiger balm" and tylenol bid with some improvement.  No head trauma. Daughter reports that patients mental status is at baseline.   Review of Systems See HPI  Past Medical History:  Diagnosis Date  . Alzheimer disease    moderate to severe  . 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 Depression   . History of UTI   . Hyperglycemia 11/15/2013  . Hyperlipidemia   . Macular degeneration 11/24/2015  . Medicare annual wellness visit, subsequent 10/17/2014  . Osteoporosis    s/p zometa  3.3 mg IV 05/2007  . Radiation 04/2006   s/p external radiation -Arimidex since 04/12/2006  . Renal insufficiency 11/15/2013  . S/P lumpectomy of breast 01/2006   and sentinel node dissection   . Thyroid nodule   . Vision loss, left eye    secondary to retinal hemorrhage, unclear etiology     Social History   Social History  . Marital status: Widowed    Spouse name: N/A  . Number of children: 3  . Years of education: N/A   Occupational History  . Retired Retired    Public relations account executive   Social History Main Topics  . Smoking status: Former Research scientist (life sciences)  . Smokeless tobacco: Never Used     Comment: light smoker- smoked 3 or 4 cigarettes a day for 55 years  . Alcohol use Not on file  . Drug use: Unknown  . Sexual activity: Not on file   Other Topics Concern  . Not on file   Social History Narrative   Patient is widowed, is  the retired Public relations account executive   currently lives with her daughter.  She has a total of 3 children, two daughters and one son.     No alcohol.  She quit tobacco approximately 2 years ago.  She   was a light smoker, smoked 3 or 4 cigarettes a day for 55 years.      Past Surgical History:  Procedure Laterality Date  . LOBECTOMY  08/2009   thyroid right-mixed and macrofollicular adenoma, 8.5 cm, malignant features are not identified  . VAGINAL HYSTERECTOMY  1960's   status post    Family History  Problem Relation Age of Onset  . Breast cancer Mother   . Diabetes Father     adult onset type 2  . Other Neg Hx     No report of heart disease or stroke in the family    Allergies  Allergen Reactions  . Codeine Phosphate     REACTION: unspecified  . Ibuprofen     REACTION: unspecified  . Moxifloxacin     REACTION: rash    Current Outpatient Prescriptions on File Prior to Visit  Medication Sig Dispense Refill  . aspirin 81 MG tablet Take 81 mg by mouth daily.      Marland Kitchen  calcium carbonate (OS-CAL) 600 MG TABS Take 600 mg by mouth 2 (two) times daily with a meal.      . Cholecalciferol (VITAMIN D3) 1000 UNITS tablet Take 1,000 Units by mouth daily.      . Cyanocobalamin 1000 MCG CAPS Take by mouth.    . donepezil (ARICEPT) 10 MG tablet Take 1 tablet (10 mg total) by mouth at bedtime. 30 tablet 6  . furosemide (LASIX) 20 MG tablet Take 1 tablet (20 mg total) by mouth daily. 90 tablet 0  . KLOR-CON M10 10 MEQ tablet TAKE 1 TABLET (10 MEQ TOTAL) BY MOUTH DAILY. 90 tablet 1  . levothyroxine (SYNTHROID, LEVOTHROID) 50 MCG tablet TAKE 1 TABLET (50 MCG TOTAL) BY MOUTH DAILY. 30 tablet 2  . mirtazapine (REMERON) 15 MG tablet TAKE 1 TABLET (15 MG TOTAL) BY MOUTH AT BEDTIME. 90 tablet 1  . NAMENDA XR 28 MG CP24 24 hr capsule TAKE 1 CAPSULE (28 MG TOTAL) BY MOUTH DAILY. 30 capsule 6  . simvastatin (ZOCOR) 10 MG tablet TAKE 1 TABLET (10 MG TOTAL) BY MOUTH DAILY. 90 tablet 1   No current  facility-administered medications on file prior to visit.     BP (!) 107/50 (BP Location: Left Arm, Patient Position: Sitting, Cuff Size: Normal)   Pulse 68   Temp 98.6 F (37 C) (Oral)   Resp 18   Ht 5\' 8"  (1.727 m)   Wt 167 lb 3.2 oz (75.8 kg)   SpO2 98% Comment: RA  BMI 25.42 kg/m       Objective:   Physical Exam  Constitutional: She appears well-developed and well-nourished.  HENT:  Bilateral cerumen impaction noted  Cardiovascular: Normal rate, regular rhythm and normal heart sounds.   No murmur heard. Pulmonary/Chest: Effort normal and breath sounds normal. No respiratory distress. She has no wheezes.  Musculoskeletal:  + tenderness to palpation of left posterior rib cage.    Neurological: She is alert.  Skin: Skin is warm and dry.  Psychiatric: She has a normal mood and affect. Cognition and memory are impaired.          Assessment & Plan:  Left sided rib pain- will obtain an x ray of the left ribs to rule out fracture.  Continue tylenol prn.    Bilateral cerumen impaction-   Ceruminosis is noted.  Wax is removed by syringing and manual debridement. Instructions for home care to prevent wax buildup are given to the daughter

## 2016-12-12 ENCOUNTER — Other Ambulatory Visit: Payer: Self-pay | Admitting: Family Medicine

## 2017-01-15 ENCOUNTER — Other Ambulatory Visit: Payer: Self-pay | Admitting: Family Medicine

## 2017-01-18 ENCOUNTER — Other Ambulatory Visit: Payer: Self-pay | Admitting: Family Medicine

## 2017-01-29 ENCOUNTER — Other Ambulatory Visit: Payer: Self-pay | Admitting: Family Medicine

## 2017-02-12 ENCOUNTER — Other Ambulatory Visit: Payer: Self-pay | Admitting: Family Medicine

## 2017-02-12 MED ORDER — DONEPEZIL HCL 10 MG PO TABS: 10.0000 mg | ORAL_TABLET | Freq: Every day | ORAL | 6 refills | Status: DC

## 2017-02-22 ENCOUNTER — Other Ambulatory Visit: Payer: Self-pay | Admitting: Family Medicine

## 2017-04-06 ENCOUNTER — Other Ambulatory Visit: Payer: Self-pay | Admitting: Family Medicine

## 2017-04-21 ENCOUNTER — Other Ambulatory Visit: Payer: Self-pay | Admitting: Family Medicine

## 2017-05-02 ENCOUNTER — Other Ambulatory Visit: Payer: Self-pay | Admitting: Family Medicine

## 2017-05-02 MED ORDER — SIMVASTATIN 10 MG PO TABS: 10.0000 mg | ORAL_TABLET | Freq: Every day | ORAL | 0 refills | Status: DC

## 2017-05-22 ENCOUNTER — Other Ambulatory Visit: Payer: Self-pay | Admitting: Family Medicine

## 2017-06-22 ENCOUNTER — Other Ambulatory Visit: Payer: Self-pay | Admitting: Family Medicine

## 2017-06-28 NOTE — Progress Notes (Signed)
Subjective:   Carrie Stephens is a 83 y.o. female who presents for Medicare Annual (Subsequent) preventive examination.  Carrie Stephens is accompanied by her daughter Carrie Stephens who is also her caregiver.  Review of Systems:  No ROS.  Medicare Wellness Visit. Additional risk factors are reflected in the social history. Cardiac Risk Factors include: advanced age (>60men, >48 women);dyslipidemia;sedentary lifestyle Sleep patterns:   Sleeps all night per daughter. Home Safety/Smoke Alarms: Feels safe in home. Smoke alarms in place.  Living environment; residence and Adult nurse: lives with daughter and son n law in 2 story home.  Seat Belt Safety/Bike Helmet: Wears seat belt.   Counseling:   Eye Exam- No longer doing routine screening due to age per dtr. Dental- Dr.Artis every 3 months  Female:   Pap- Hysterectomy      Mammo- No longer doing routine screening due to age and alzheimer's per dtr. Dexa scan- No longer doing routine screening due to age and alzheimer's per dtr. CCS- No longer doing routine screening due to age and alzheimer's per dtr.     Objective:     Vitals: BP (!) 110/58 (BP Location: Right Arm, Patient Position: Sitting, Cuff Size: Normal)   Pulse (!) 56   Ht 5\' 8"  (1.727 m)   Wt 154 lb (69.9 kg)   SpO2 98%   BMI 23.42 kg/m   Body mass index is 23.42 kg/m.   Tobacco History  Smoking Status  . Former Smoker  Smokeless Tobacco  . Never Used    Comment: light smoker- smoked 3 or 4 cigarettes a day for 71 years     Counseling given: Not Answered   Past Medical History:  Diagnosis Date  . Alzheimer disease    moderate to severe  . 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 Depression   . History of UTI   . Hyperglycemia 11/15/2013  . Hyperlipidemia   . Macular degeneration 11/24/2015  . Medicare annual wellness visit,  subsequent 10/17/2014  . Osteoporosis    s/p zometa  3.3 mg IV 05/2007  . Radiation 04/2006   s/p external radiation -Arimidex since 04/12/2006  . Renal insufficiency 11/15/2013  . S/P lumpectomy of breast 01/2006   and sentinel node dissection   . Thyroid nodule   . Vision loss, left eye    secondary to retinal hemorrhage, unclear etiology   Past Surgical History:  Procedure Laterality Date  . LOBECTOMY  08/2009   thyroid right-mixed and macrofollicular adenoma, 8.5 cm, malignant features are not identified  . VAGINAL HYSTERECTOMY  1960's   status post   Family History  Problem Relation Age of Onset  . Breast cancer Mother   . Diabetes Father        adult onset type 2  . Other Neg Hx        No report of heart disease or stroke in the family   History  Sexual Activity  . Sexual activity: No    Outpatient Encounter Prescriptions as of 07/04/2017  Medication Sig  . aspirin 81 MG tablet Take 81 mg by mouth daily.    . calcium carbonate (OS-CAL) 600 MG TABS Take 600 mg by mouth 2 (two) times daily with a meal.    . Cholecalciferol (VITAMIN D3) 1000 UNITS tablet Take 1,000 Units by mouth daily.    . Cyanocobalamin  [place] Take by mouth.  . donepezil (ARICEPT) 10 MG tablet Take 1 tablet (10 mg total) by mouth at bedtime.  . furosemide (LASIX) 20 MG tablet TAKE 1 TABLET (20 MG TOTAL) BY MOUTH DAILY.  Marland Kitchen KLOR-CON M10 10 MEQ tablet TAKE 1 TABLET (10 MEQ TOTAL) BY MOUTH DAILY.  Marland Kitchen levothyroxine (SYNTHROID, LEVOTHROID) 50 MCG tablet TAKE 1 TABLET (50 MCG TOTAL) BY MOUTH DAILY.  . mirtazapine (REMERON) 15 MG tablet TAKE 1 TABLET BY MOUTH AT BEDTIME  . NAMENDA XR 28 MG CP24 24 hr capsule TAKE 1 CAPSULE BY MOUTH EVERY DAY  . Probiotic Product (PROBIOTIC-10 PO) Take by mouth.  . simvastatin (ZOCOR) 10 MG tablet Take 1 tablet (10 mg total) by mouth daily at 6 PM.   No facility-administered encounter medications on file as of 07/04/2017.     Activities of Daily Living In your  present state of health, do you have any difficulty performing the following activities: 07/04/2017 11/12/2016  Hearing? Y N  Vision? N N  Difficulty concentrating or making decisions? Carrie Stephens  Walking or climbing stairs? N Y  Dressing or bathing? Y Y  Doing errands, shopping? Carrie Stephens  Preparing Food and eating ? Y -  Using the Toilet? N -  In the past six months, have you accidently leaked urine? N -  Do you have problems with loss of bowel control? N -  Managing your Medications? Y -  Managing your Finances? Y -  Housekeeping or managing your Housekeeping? Y -  Some recent data might be hidden    Patient Care Team: Mosie Lukes, MD as PCP - General (Family Medicine)    Assessment:    Physical assessment deferred to PCP.  Exercise Activities and Dietary recommendations Current Exercise Habits: The patient does not participate in regular exercise at present, Exercise limited by: psychological condition(s)   Diet (meal preparation, eat out, water intake, caffeinated beverages, dairy products, fruits and vegetables): Dtr states pt still has a very good appetite.    Goals    None     Fall Risk Fall Risk  07/04/2017 11/12/2016 11/24/2015 10/11/2014 11/11/2013  Falls in the past year? Yes Yes No No No  Number falls in past yr: 1 (No Data) - - -  Injury with Fall? Yes No - - -  Follow up Education provided;Falls prevention discussed - - - -   Depression Screen PHQ 2/9 Scores 07/04/2017 11/12/2016 11/12/2016 11/24/2015  PHQ - 2 Score 0 1 0 0     Cognitive Function        Immunization History  Administered Date(s) Administered  . Influenza Split 09/04/2012  . Influenza Whole 09/27/2009, 10/03/2010  . Influenza,inj,Quad PF,36+ Mos 11/24/2015  . Influenza-Unspecified 08/24/2013  . Pneumococcal Conjugate-13 11/24/2015  . Tdap 11/11/2013   Screening Tests Health Maintenance  Topic Date Due  . PNA vac Low Risk Adult (2 of 2 - PPSV23) 11/23/2016  . INFLUENZA VACCINE   07/24/2017  . TETANUS/TDAP  11/12/2023  . DEXA SCAN  Completed      Plan:   Follow up with Dr.Blyth today as scheduled.   I have personally reviewed and noted the following in the patient's chart:   . Medical and social history . Use of alcohol, tobacco or illicit drugs  . Current medications and supplements . Functional ability and status . Nutritional status . Physical activity . Advanced directives . List of other physicians . Hospitalizations, surgeries, and ER visits in previous 12 months .  Vitals . Screenings to include cognitive, depression, and falls . Referrals and appointments  In addition, I have reviewed and discussed with patient certain preventive protocols, quality metrics, and best practice recommendations. A written personalized care plan for preventive services as well as general preventive health recommendations were provided to patient.     Naaman Plummer [place], [place]  07/04/2017

## 2017-07-04 ENCOUNTER — Encounter: Payer: Self-pay | Admitting: Family Medicine

## 2017-07-04 ENCOUNTER — Ambulatory Visit (INDEPENDENT_AMBULATORY_CARE_PROVIDER_SITE_OTHER): Payer: Medicare Other | Admitting: Family Medicine

## 2017-07-04 VITALS — BP 110/58 | HR 56 | Ht 68.0 in | Wt 154.0 lb

## 2017-07-04 DIAGNOSIS — E538 Deficiency of other specified B group vitamins: Secondary | ICD-10-CM

## 2017-07-04 DIAGNOSIS — Z0000 Encounter for general adult medical examination without abnormal findings: Secondary | ICD-10-CM | POA: Diagnosis not present

## 2017-07-04 DIAGNOSIS — E039 Hypothyroidism, unspecified: Secondary | ICD-10-CM | POA: Diagnosis not present

## 2017-07-04 DIAGNOSIS — E785 Hyperlipidemia, unspecified: Secondary | ICD-10-CM | POA: Diagnosis not present

## 2017-07-04 DIAGNOSIS — E870 Hyperosmolality and hypernatremia: Secondary | ICD-10-CM

## 2017-07-04 DIAGNOSIS — N289 Disorder of kidney and ureter, unspecified: Secondary | ICD-10-CM

## 2017-07-04 DIAGNOSIS — M810 Age-related osteoporosis without current pathological fracture: Secondary | ICD-10-CM | POA: Diagnosis not present

## 2017-07-04 DIAGNOSIS — R739 Hyperglycemia, unspecified: Secondary | ICD-10-CM | POA: Diagnosis not present

## 2017-07-04 NOTE — Assessment & Plan Note (Signed)
On Levothyroxine, continue to monitor 

## 2017-07-04 NOTE — Assessment & Plan Note (Addendum)
Check level today, still high drop supplements by 2 more weekly

## 2017-07-04 NOTE — Assessment & Plan Note (Signed)
Encouraged heart healthy diet, increase exercise, avoid trans fats, consider a krill oil cap daily 

## 2017-07-04 NOTE — Assessment & Plan Note (Signed)
Encouraged to get adequate exercise, calcium and vitamin d intake 

## 2017-07-04 NOTE — Assessment & Plan Note (Signed)
Check cmp today 

## 2017-07-04 NOTE — Patient Instructions (Addendum)
NOW company at [facility] or [email or web address] Ms. Rosman , Thank you for taking time to come for your Medicare Wellness Visit. I appreciate your ongoing commitment to your health goals. Please review the following plan we discussed and let me know if I can assist you in the future.   These are the goals we discussed: Goals    None      This is a list of the screening recommended for you and due dates:  Health Maintenance  Topic Date Due  . Pneumonia vaccines (2 of 2 - PPSV23) 11/23/2016  . Flu Shot  07/24/2017  . Tetanus Vaccine  11/12/2023  . DEXA scan (bone density measurement)  Completed   .

## 2017-07-05 ENCOUNTER — Encounter: Payer: Self-pay | Admitting: Family Medicine

## 2017-07-05 DIAGNOSIS — E870 Hyperosmolality and hypernatremia: Secondary | ICD-10-CM

## 2017-07-05 HISTORY — DX: Hyperosmolality and hypernatremia: E87.0

## 2017-07-05 LAB — LIPID PANEL
CHOLESTEROL: 139 mg/dL (ref 0–200)
HDL: 62 mg/dL (ref >39.00)
LDL CALC: 45 mg/dL (ref 0–99)
NonHDL: 76.94
TRIGLYCERIDES: 158 mg/dL — AB (ref 0.0–149.0)
Total CHOL/HDL Ratio: 2
VLDL: 31.6 mg/dL (ref 0.0–40.0)

## 2017-07-05 LAB — CBC
HEMATOCRIT: 40.6 % (ref 36.0–46.0)
HEMOGLOBIN: 13.6 g/dL (ref 12.0–15.0)
MCHC: 33.5 g/dL (ref 30.0–36.0)
MCV: 93.2 fl (ref 78.0–100.0)
PLATELETS: 250 10*3/uL (ref 150.0–400.0)
RBC: 4.36 Mil/uL (ref 3.87–5.11)
RDW: 13.7 % (ref 11.5–15.5)
WBC: 5.4 10*3/uL (ref 4.0–10.5)

## 2017-07-05 LAB — COMPREHENSIVE METABOLIC PANEL
ALBUMIN: 4 g/dL (ref 3.5–5.2)
ALK PHOS: 50 U/L (ref 39–117)
ALT: 6 U/L (ref 0–35)
AST: 17 U/L (ref 0–37)
BUN: 19 mg/dL (ref 6–23)
CALCIUM: 9.5 mg/dL (ref 8.4–10.5)
CHLORIDE: 107 meq/L (ref 96–112)
CO2: 28 mEq/L (ref 19–32)
Creatinine, Ser: 1.44 mg/dL — ABNORMAL HIGH (ref 0.40–1.20)
GFR: 43.46 mL/min — ABNORMAL LOW (ref >60.00)
Glucose, Bld: 134 mg/dL — ABNORMAL HIGH (ref 70–99)
POTASSIUM: 4.1 meq/L (ref 3.5–5.1)
Sodium: 146 mEq/L — ABNORMAL HIGH (ref 135–145)
Total Bilirubin: 0.6 mg/dL (ref 0.2–1.2)
Total Protein: 6.5 g/dL (ref 6.0–8.3)

## 2017-07-05 LAB — VITAMIN D 25 HYDROXY (VIT D DEFICIENCY, FRACTURES): VITD: 40.53 ng/mL (ref 30.00–100.00)

## 2017-07-05 LAB — VITAMIN B12: Vitamin B-12: >1500 pg/mL — ABNORMAL HIGH (ref 211–911)

## 2017-07-05 LAB — HEMOGLOBIN A1C: Hgb A1c MFr Bld: 5.5 % (ref 4.6–6.5)

## 2017-07-05 LAB — TSH: TSH: 0.56 u[IU]/mL (ref 0.35–4.50)

## 2017-07-05 NOTE — Progress Notes (Signed)
Patient ID: Carrie Stephens, female   DOB: 07-15-1920, 83 y.o.   MRN: [id number]   Subjective:    Patient ID: Carrie Stephens, female    DOB: 1920-10-18, 89 y.o.   MRN: [id number]  Chief Complaint  Patient presents with  . Medicare Wellness    with RN    HPI Patient is in today for AWV with health coach and follow up on chronic medical concerns such as dementia and hyperglycemia. She is accompanied by her daughter and is in a wheelchair. Her dementia continues to worsen. She is sleeping more hours each day. Does have some hallucinations at times which are not disturbing to her. She needs total care but her family reports she is sleeping better and has become easier to work with. No recent febrile illness or any emergency room. She is eating well and voiding well. Denies CP/palp/SOB/HA/congestion/fevers/GI or GU c/o. Taking meds as prescribed  Past Medical History:  Diagnosis Date  . Alzheimer disease    moderate to severe  . 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 Depression   . History of UTI   . Hyperglycemia 11/15/2013  . Hyperlipidemia   . Hypernatremia 07/05/2017  . Macular degeneration 11/24/2015  . Medicare annual wellness visit, subsequent 10/17/2014  . Osteoporosis    s/p zometa  3.3 mg IV 05/2007  . Radiation 04/2006   s/p external radiation -Arimidex since 04/12/2006  . Renal insufficiency 11/15/2013  . S/P lumpectomy of breast 01/2006   and sentinel node dissection   . Thyroid nodule   . Vision loss, left eye    secondary to retinal hemorrhage, unclear etiology    Past Surgical History:  Procedure Laterality Date  . LOBECTOMY  08/2009   thyroid right-mixed and macrofollicular adenoma, 8.5 cm, malignant features are not identified  . VAGINAL HYSTERECTOMY  1960's   status post    Family History  Problem Relation Age of Onset  .  Breast cancer Mother   . Diabetes Father        adult onset type 2  . Other Neg Hx        No report of heart disease or stroke in the family    Social History   Social History  . Marital status: Widowed    Spouse name: N/A  . Number of children: 3  . Years of education: N/A   Occupational History  . Retired Retired    Public relations account executive   Social History Main Topics  . Smoking status: Former Research scientist (life sciences)  . Smokeless tobacco: Never Used     Comment: light smoker- smoked 3 or 4 cigarettes a day for 55 years  . Alcohol use No  . Drug use: No  . Sexual activity: No   Other Topics Concern  . 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  She has a total of 3 children, two daughters and one son.     No alcohol.  She quit tobacco approximately 2 years ago.  She   was a light smoker, smoked 3 or 4 cigarettes a day for 55 years.      Outpatient Medications Prior to Visit  Medication Sig Dispense Refill  . aspirin 81 MG tablet Take 81 mg  by mouth daily.      . calcium carbonate (OS-CAL) 600 MG TABS Take 600 mg by mouth 2 (two) times daily with a meal.      . Cholecalciferol (VITAMIN D3) 1000 UNITS tablet Take 1,000 Units by mouth daily.      . Cyanocobalamin 1000 MCG CAPS Take by mouth.    . donepezil (ARICEPT) 10 MG tablet Take 1 tablet (10 mg total) by mouth at bedtime. 30 tablet 6  . furosemide (LASIX) 20 MG tablet TAKE 1 TABLET (20 MG TOTAL) BY MOUTH DAILY. 90 tablet 0  . KLOR-CON M10 10 MEQ tablet TAKE 1 TABLET (10 MEQ TOTAL) BY MOUTH DAILY. 90 tablet 0  . levothyroxine (SYNTHROID, LEVOTHROID) 50 MCG tablet TAKE 1 TABLET (50 MCG TOTAL) BY MOUTH DAILY. 30 tablet 3  . mirtazapine (REMERON) 15 MG tablet TAKE 1 TABLET BY MOUTH AT BEDTIME 90 tablet 1  . NAMENDA XR 28 MG CP24 24 hr capsule TAKE 1 CAPSULE BY MOUTH EVERY DAY 30 capsule 1  . Probiotic Product (PROBIOTIC-10 PO) Take by mouth.    .  simvastatin (ZOCOR) 10 MG tablet Take 1 tablet (10 mg total) by mouth daily at 6 PM. 90 tablet 0   No facility-administered medications prior to visit.     Allergies  Allergen Reactions  . Codeine Phosphate     REACTION: unspecified  . Ibuprofen     REACTION: unspecified  . Moxifloxacin     REACTION: rash    Review of Systems  Constitutional: Positive for malaise/fatigue. Negative for chills and fever.  HENT: Negative for congestion and hearing loss.   Eyes: Negative for discharge.  Respiratory: Negative for cough, sputum production and shortness of breath.   Cardiovascular: Negative for chest pain, palpitations and leg swelling.  Gastrointestinal: Negative for abdominal pain, blood in stool, constipation, diarrhea, heartburn, nausea and vomiting.  Genitourinary: Negative for dysuria, frequency, hematuria and urgency.  Musculoskeletal: Negative for back pain, falls and myalgias.  Skin: Negative for rash.  Neurological: Negative for dizziness, sensory change, loss of consciousness, weakness and headaches.  Endo/Heme/Allergies: Negative for environmental allergies. Does not bruise/bleed easily.  Psychiatric/Behavioral: Positive for hallucinations and memory loss. Negative for depression, substance abuse and suicidal ideas. The patient is not nervous/anxious and does not have insomnia.        Objective:    Physical Exam  Constitutional: She is oriented to person, place, and time. She appears well-developed and well-nourished. No distress.  HENT:  Head: Normocephalic and atraumatic.  Nose: Nose normal.  Eyes: Right eye exhibits no discharge. Left eye exhibits no discharge.  Neck: Normal range of motion. Neck supple.  Cardiovascular: Normal rate and regular rhythm.   No murmur heard. Pulmonary/Chest: Effort normal and breath sounds normal.  Abdominal: Soft. Bowel sounds are normal. There is no tenderness.  Musculoskeletal: She exhibits no edema.  Neurological: She is alert and  oriented to person, place, and time.  Skin: Skin is warm and dry.  Psychiatric: She has a normal mood and affect.  Nursing note and vitals reviewed.   BP (!) 110/58 (BP Location: Right Arm, Patient Position: Sitting, Cuff Size: Normal)   Pulse (!) 56   Ht 5\' 8"  (1.727 m)   Wt 154 lb (69.9 kg)   SpO2 98%   BMI 23.42 kg/m  Wt Readings from Last 3 Encounters:  07/04/17 154 lb (69.9 kg)  11/12/16 167 lb 3.2 oz (75.8 kg)  10/15/16 160 lb 3.2 oz (72.7 kg)  Lab Results  Component Value Date   WBC 5.4 07/04/2017   HGB 13.6 07/04/2017   HCT 40.6 07/04/2017   PLT 250.0 07/04/2017   GLUCOSE 134 (H) 07/04/2017   CHOL 139 07/04/2017   TRIG 158.0 (H) 07/04/2017   HDL 62.00 07/04/2017   LDLDIRECT 72.0 11/24/2015   LDLCALC 45 07/04/2017   ALT 6 07/04/2017   AST 17 07/04/2017   NA 146 (H) 07/04/2017   K 4.1 07/04/2017   CL 107 07/04/2017   CREATININE 1.44 (H) 07/04/2017   BUN 19 07/04/2017   CO2 28 07/04/2017   TSH 0.56 07/04/2017   HGBA1C 5.5 07/04/2017    Lab Results  Component Value Date   TSH 0.56 07/04/2017   Lab Results  Component Value Date   WBC 5.4 07/04/2017   HGB 13.6 07/04/2017   HCT 40.6 07/04/2017   MCV 93.2 07/04/2017   PLT 250.0 07/04/2017   Lab Results  Component Value Date   NA 146 (H) 07/04/2017   K 4.1 07/04/2017   CO2 28 07/04/2017   GLUCOSE 134 (H) 07/04/2017   BUN 19 07/04/2017   CREATININE 1.44 (H) 07/04/2017   BILITOT 0.6 07/04/2017   ALKPHOS 50 07/04/2017   AST 17 07/04/2017   ALT 6 07/04/2017   PROT 6.5 07/04/2017   ALBUMIN 4.0 07/04/2017   CALCIUM 9.5 07/04/2017   GFR 43.46 (L) 07/04/2017   Lab Results  Component Value Date   CHOL 139 07/04/2017   Lab Results  Component Value Date   HDL 62.00 07/04/2017   Lab Results  Component Value Date   LDLCALC 45 07/04/2017   Lab Results  Component Value Date   TRIG 158.0 (H) 07/04/2017   Lab Results  Component Value Date   CHOLHDL 2 07/04/2017   Lab Results  Component  Value Date   HGBA1C 5.5 07/04/2017       Assessment & Plan:   Problem List Items Addressed This Visit    Hypothyroidism    On Levothyroxine, continue to monitor      Relevant Orders   Lipid panel (Completed)   TSH (Completed)   Hyperlipidemia    Encouraged heart healthy diet, increase exercise, avoid trans fats, consider a krill oil cap daily      Osteoporosis    Encouraged to get adequate exercise, calcium and vitamin d intake      Relevant Orders   VITAMIN D 25 Hydroxy (Vit-D Deficiency, Fractures) (Completed)   Vitamin B 12 deficiency    Check level today, still high drop supplements by 2 more weekly      Relevant Orders   CBC (Completed)   Vitamin B12 (Completed)   Renal insufficiency    Check cmp today      Relevant Orders   CBC (Completed)   Hyperglycemia    hgba1c acceptable, minimize simple carbs. Increase exercise as tolerated.      Relevant Orders   Hemoglobin A1c (Completed)   Comprehensive metabolic panel (Completed)   Hypernatremia    Very mild and asymptomatic, no further changes       Other Visit Diagnoses    Encounter for Medicare annual wellness exam    -  Primary      I am having Ms. Westberg maintain her aspirin, calcium carbonate, cholecalciferol, Cyanocobalamin, Probiotic Product (PROBIOTIC-10 PO), donepezil, mirtazapine, levothyroxine, furosemide, simvastatin, NAMENDA XR, and KLOR-CON M10.  No orders of the defined types were placed in this encounter.    Penni Homans, MD

## 2017-07-05 NOTE — Assessment & Plan Note (Signed)
hgba1c acceptable, minimize simple carbs. Increase exercise as tolerated.  

## 2017-07-05 NOTE — Assessment & Plan Note (Signed)
Very mild and asymptomatic, no further changes

## 2017-07-11 ENCOUNTER — Other Ambulatory Visit: Payer: Self-pay | Admitting: Family Medicine

## 2017-07-22 ENCOUNTER — Other Ambulatory Visit: Payer: Self-pay | Admitting: Family Medicine

## 2017-07-27 ENCOUNTER — Other Ambulatory Visit: Payer: Self-pay | Admitting: Family Medicine

## 2017-08-01 ENCOUNTER — Other Ambulatory Visit: Payer: Self-pay | Admitting: Family Medicine

## 2017-08-01 NOTE — Telephone Encounter (Signed)
Refilled to [facility]

## 2017-08-01 NOTE — Telephone Encounter (Signed)
Pt's son called in to request a refill for simvastatin. Son said that pt is currently out of medication and is in [place] with him.    [facility] [place]  [address]  Phone : [phone]

## 2017-08-06 ENCOUNTER — Other Ambulatory Visit: Payer: Self-pay | Admitting: Family Medicine

## 2017-08-06 ENCOUNTER — Telehealth: Payer: Self-pay | Admitting: Family Medicine

## 2017-08-06 NOTE — Telephone Encounter (Signed)
Pt's son is requesting a refill for 2 medications. He said that they are coming up for renewal   1. levothyroxine 2. furosemide   Pharmacy:  [id number] - [place], [place]

## 2017-09-03 ENCOUNTER — Other Ambulatory Visit: Payer: Self-pay | Admitting: Family Medicine

## 2017-09-05 ENCOUNTER — Ambulatory Visit (HOSPITAL_BASED_OUTPATIENT_CLINIC_OR_DEPARTMENT_OTHER)
Admission: RE | Admit: 2017-09-05 | Discharge: 2017-09-05 | Disposition: A | Discharge status: Home / Self Care | Payer: Medicare Other | Source: Ambulatory Visit | Attending: Medical | Admitting: Medical

## 2017-09-05 ENCOUNTER — Ambulatory Visit (INDEPENDENT_AMBULATORY_CARE_PROVIDER_SITE_OTHER): Payer: Medicare Other | Admitting: Medical

## 2017-09-05 ENCOUNTER — Encounter: Payer: Self-pay | Admitting: Medical

## 2017-09-05 VITALS — BP 112/52 | HR 53 | Temp 98.8°F | Ht 68.0 in | Wt 149.0 lb

## 2017-09-05 DIAGNOSIS — Z87440 Personal history of urinary (tract) infections: Secondary | ICD-10-CM | POA: Diagnosis not present

## 2017-09-05 DIAGNOSIS — M545 Low back pain, unspecified: Secondary | ICD-10-CM

## 2017-09-05 DIAGNOSIS — M549 Dorsalgia, unspecified: Secondary | ICD-10-CM

## 2017-09-05 DIAGNOSIS — M5137 Other intervertebral disc degeneration, lumbosacral region: Secondary | ICD-10-CM | POA: Diagnosis not present

## 2017-09-05 MED ORDER — TRAMADOL HCL 50 MG PO TABS: 50.0000 mg | ORAL_TABLET | Freq: Two times a day (BID) | ORAL | 0 refills | Status: AC | PRN

## 2017-09-05 NOTE — Patient Instructions (Addendum)
For your lower back pain, we will get lumbar spine x-ray to see if you have possible bone condition causing pain such as compression fracture?  Would advise Tylenol for moderate pain. Unfortunately I don't think NSAIDs are a good option due to your decreased kidney function. I am going to make limited number of tramadol to use for severe pain. Caution with tramadol use as I have concern it may cause some sedation. So I will advise maybe just using one tablet a day and in the morning. Make sure you watch her closely and make sure you help her ambulate. Would not recommend any nighttime use of tramadol.  We will collect a urine today and do a culture to make sure the right-sided back pain over kidney region is not UTI related.  Follow-up in 7-10 days or as needed.

## 2017-09-05 NOTE — Progress Notes (Signed)
Subjective:    Patient ID: Carrie Stephens, female    DOB: 13-May-1920, 83 y.o.   MRN: [id number]  HPI  Pt in for some recent back pain since Tuesday. Pt daughter states mild tenderness to palpation. When she stands or changes positions will hurt. Worse appearance of pain on standing.   Even when sits in larger chair and adjust position appears to hurt.  No pain on urination. But  history of uti  in past without any UTI-like symptoms.  No fall or trauma.  No pain radiating to legs. No saddle anesthesia.   Pt lives with daughter. She ambulates by herself around daughters house.    Review of Systems  Constitutional: Negative for chills, fatigue and fever.  Respiratory: Negative for cough, chest tightness, shortness of breath and wheezing.   Cardiovascular: Negative for chest pain and palpitations.  Gastrointestinal: Negative for abdominal distention, abdominal pain, blood in stool, constipation, nausea and rectal pain.  Genitourinary: Negative for difficulty urinating, dysuria, flank pain, frequency, genital sores, hematuria and urgency.       Hxof uti with no symptoms in past per daughter.  Musculoskeletal: Positive for back pain. Negative for arthralgias, myalgias, neck pain and neck stiffness.  Neurological: Negative for dizziness, seizures, speech difficulty, weakness, light-headedness and headaches.  Hematological: Negative for adenopathy. Does not bruise/bleed easily.  Psychiatric/Behavioral: Negative for behavioral problems and confusion.    Past Medical History:  Diagnosis Date  . Alzheimer disease    moderate to severe  . 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 Depression   . History of UTI   . Hyperglycemia 11/15/2013  . Hyperlipidemia   . Hypernatremia 07/05/2017  . Macular degeneration 11/24/2015  . Medicare annual wellness  visit, subsequent 10/17/2014  . Osteoporosis    s/p zometa  3.3 mg IV 05/2007  . Radiation 04/2006   s/p external radiation -Arimidex since 04/12/2006  . Renal insufficiency 11/15/2013  . S/P lumpectomy of breast 01/2006   and sentinel node dissection   . Thyroid nodule   . Vision loss, left eye    secondary to retinal hemorrhage, unclear etiology     Social History   Social History  . Marital status: Widowed    Spouse name: N/A  . Number of children: 3  . Years of education: N/A   Occupational History  . Retired Retired    Public relations account executive   Social History Main Topics  . Smoking status: Former Research scientist (life sciences)  . Smokeless tobacco: Never Used     Comment: light smoker- smoked 3 or 4 cigarettes a day for 55 years  . Alcohol use No  . Drug use: No  . Sexual activity: No   Other Topics Concern  . 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  She has a total of 3 children, two daughters and one son.     No alcohol.  She quit tobacco approximately 2 years ago.  She   was a light smoker, smoked 3 or 4 cigarettes a day for 55 years.      Past Surgical History:  Procedure Laterality Date  . LOBECTOMY  08/2009   thyroid right-mixed and macrofollicular adenoma, 8.5 cm, malignant features are not identified  . VAGINAL  HYSTERECTOMY  1960's   status post    Family History  Problem Relation Age of Onset  . Breast cancer Mother   . Diabetes Father        adult onset type 2  . Other Neg Hx        No report of heart disease or stroke in the family    Allergies  Allergen Reactions  . Codeine Phosphate     REACTION: unspecified  . Ibuprofen     REACTION: unspecified  . Moxifloxacin     REACTION: rash    Current Outpatient Prescriptions on File Prior to Visit  Medication Sig Dispense Refill  . aspirin 81 MG tablet Take 81 mg by mouth daily.      . calcium carbonate (OS-CAL) 600 MG  TABS Take 600 mg by mouth 2 (two) times daily with a meal.      . Cholecalciferol (VITAMIN D3) 1000 UNITS tablet Take 1,000 Units by mouth daily.      . Cyanocobalamin 1000 MCG CAPS Take by mouth.    . donepezil (ARICEPT) 10 MG tablet TAKE 1 TABLET (10 MG TOTAL) BY MOUTH AT BEDTIME. 30 tablet 3  . furosemide (LASIX) 20 MG tablet TAKE 1 TABLET (20 MG TOTAL) BY MOUTH DAILY. 90 tablet 1  . KLOR-CON M10 10 MEQ tablet TAKE 1 TABLET (10 MEQ TOTAL) BY MOUTH DAILY. 90 tablet 0  . levothyroxine (SYNTHROID, LEVOTHROID) 50 MCG tablet TAKE 1 TABLET (50 MCG TOTAL) BY MOUTH DAILY. 30 tablet 1  . memantine (NAMENDA XR) 28 MG CP24 24 hr capsule TAKE 1 CAPSULE BY MOUTH EVERY DAY 30 capsule 1  . mirtazapine (REMERON) 15 MG tablet TAKE 1 TABLET BY MOUTH AT BEDTIME 90 tablet 0  . Probiotic Product (PROBIOTIC-10 PO) Take by mouth.    . simvastatin (ZOCOR) 10 MG tablet TAKE 1 TABLET (10 MG TOTAL) BY MOUTH DAILY AT 6 PM. 90 tablet 0   No current facility-administered medications on file prior to visit.     BP (!) 112/52   Pulse (!) 53   Temp 98.8 F (37.1 C) (Oral)   Ht 5\' 8"  (1.727 m)   Wt 149 lb (67.6 kg)   SpO2 100%   BMI 22.66 kg/m       Objective:   Physical Exam  General- No acute distress. Pleasant patient. Neck- Full range of motion, no jvd Lungs- Clear, even and unlabored. Heart- regular rate and rhythm. Neurologic- CNII- XII grossly intact.  Back- direct very tender lumbar spine. Rt si area Tender as well. Also some faint right CVA region tenderness to palpation.     Assessment & Plan:  For your lower back pain, we will get lumbar spine x-ray to see if you have possible bone condition causing pain such as compression fracture?  Would advise Tylenol for moderate pain. Unfortunately I don't think NSAIDs are a good option due to your decreased kidney function. I am going to make limited number of tramadol to use for severe pain. Caution with tramadol use as I have concern it may cause some  sedation. So I will advise maybe just using one tablet a day and in the morning. Make sure you watch her closely and make sure you help her ambulate. Would not recommend any nighttime use of tramadol.  We will collect a urine today and do a culture to make sure the right-sided back pain over kidney region is not UTI related.  Follow-up in 7-10 days or as needed.  Gudelia Eugene, Percell Sam, PA-C

## 2017-09-06 LAB — URINE CULTURE
MICRO NUMBER:: 81012143
SPECIMEN QUALITY: ADEQUATE

## 2017-09-10 ENCOUNTER — Telehealth: Payer: Self-pay | Admitting: Family Medicine

## 2017-09-10 NOTE — Telephone Encounter (Signed)
Pt's daughter returned call for lab results

## 2017-09-12 ENCOUNTER — Ambulatory Visit (INDEPENDENT_AMBULATORY_CARE_PROVIDER_SITE_OTHER): Payer: Medicare Other | Admitting: Medical

## 2017-09-12 ENCOUNTER — Encounter: Payer: Self-pay | Admitting: Medical

## 2017-09-12 VITALS — BP 100/70 | HR 56 | Temp 98.3°F | Resp 14 | Ht 68.0 in | Wt 148.2 lb

## 2017-09-12 DIAGNOSIS — M545 Low back pain, unspecified: Secondary | ICD-10-CM

## 2017-09-12 NOTE — Patient Instructions (Addendum)
The prior back pain is much improved today on exam.  I think the tramadol did help but the sedation side effect and constipation side effects would be a problem long-term. So will stop.  With no pain on exam today, I think the best way to approach recurrent pain would be to use Tylenol twice daily and to try salonpas over-the-counter lidocaine patches. If the pain were to become severe again as before then would like to reevaluate and get urine sample/culture to make sure UTI not missed.  Also consideration if recurrent severe back pain might be very low taper dose of prednisone and referral to sports medicine.  Please watch stool pattern and notify us if has constipation pattern. If bowel patterns don't normalize or any reported abdomen pain can get one view abdomen x-ray. That is not necessary today.  Follow-up as regular scheduled with PCP or as needed.

## 2017-09-12 NOTE — Progress Notes (Signed)
Subjective:    Patient ID: Carrie Stephens, female    DOB: 21-Feb-1920, 83 y.o.   MRN: [id number]  HPI  Pt in for follow up.  Pt had back pain on last visit. Pt pain was responding ok with tramadol per daughter. Pain level was decreased while taking tramadol twice daily. She was complaining a lot less on the medication.  But  some sedating effect from tramadol and constipation. Recently no bm from Thursday up until today. Daughter gave her some dulcoloax today and she had bm today at 8:30 am and again at 3:30 pm.   Xray showed degenerative disc disease. .  Pt last had tramadol Tuesday. Over the last 2 days daughter does note she really hasn't complained of much pain.    Review of Systems  Constitutional: Negative for chills, fatigue and fever.  Respiratory: Negative for cough, choking, chest tightness and shortness of breath.   Cardiovascular: Negative for chest pain and palpitations.  Gastrointestinal: Negative for abdominal distention, abdominal pain, blood in stool, constipation, diarrhea and nausea.       Had constipation up until this morning but 2 bowel movements today.  No patients normal bowel pattern per daughter is one BM every other day.  Musculoskeletal: Positive for back pain. Negative for arthralgias, joint swelling and neck stiffness.       Much better presently. And has not been on any medication/tramadol since Tuesday.  Skin: Negative for rash.  Neurological: Negative for dizziness, numbness and headaches.  Hematological: Negative for adenopathy. Does not bruise/bleed easily.  Psychiatric/Behavioral: Negative for behavioral problems, confusion and hallucinations.    Past Medical History:  Diagnosis Date  . Alzheimer disease    moderate to severe  . 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 Depression   . History of UTI   .  Hyperglycemia 11/15/2013  . Hyperlipidemia   . Hypernatremia 07/05/2017  . Macular degeneration 11/24/2015  . Medicare annual wellness visit, subsequent 10/17/2014  . Osteoporosis    s/p zometa  3.3 mg IV 05/2007  . Radiation 04/2006   s/p external radiation -Arimidex since 04/12/2006  . Renal insufficiency 11/15/2013  . S/P lumpectomy of breast 01/2006   and sentinel node dissection   . Thyroid nodule   . Vision loss, left eye    secondary to retinal hemorrhage, unclear etiology     Social History   Social History  . Marital status: Widowed    Spouse name: N/A  . Number of children: 3  . Years of education: N/A   Occupational History  . Retired Retired    Public relations account executive   Social History Main Topics  . Smoking status: Former Research scientist (life sciences)  . Smokeless tobacco: Never Used     Comment: light smoker- smoked 3 or 4 cigarettes a day for 55 years  . Alcohol use No  . Drug use: No  . Sexual activity: No   Other Topics Concern  . 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  She has a total of 3 children, two daughters and one son.     No alcohol.  She quit tobacco approximately 2 years ago.  She   was a light smoker, smoked 3 or 4 cigarettes a day  for 55 years.      Past Surgical History:  Procedure Laterality Date  . LOBECTOMY  08/2009   thyroid right-mixed and macrofollicular adenoma, 8.5 cm, malignant features are not identified  . VAGINAL HYSTERECTOMY  1960's   status post    Family History  Problem Relation Age of Onset  . Breast cancer Mother   . Diabetes Father        adult onset type 2  . Other Neg Hx        No report of heart disease or stroke in the family    Allergies  Allergen Reactions  . Codeine Phosphate     REACTION: unspecified  . Ibuprofen     REACTION: unspecified  . Moxifloxacin     REACTION: rash    Current Outpatient Prescriptions on File Prior  to Visit  Medication Sig Dispense Refill  . aspirin 81 MG tablet Take 81 mg by mouth daily.      . calcium carbonate (OS-CAL) 600 MG TABS Take 600 mg by mouth 2 (two) times daily with a meal.      . Cholecalciferol (VITAMIN D3) 1000 UNITS tablet Take 1,000 Units by mouth daily.      . Cyanocobalamin 1000 MCG CAPS Take by mouth.    . donepezil (ARICEPT) 10 MG tablet TAKE 1 TABLET (10 MG TOTAL) BY MOUTH AT BEDTIME. 30 tablet 3  . furosemide (LASIX) 20 MG tablet TAKE 1 TABLET (20 MG TOTAL) BY MOUTH DAILY. 90 tablet 1  . KLOR-CON M10 10 MEQ tablet TAKE 1 TABLET (10 MEQ TOTAL) BY MOUTH DAILY. 90 tablet 0  . levothyroxine (SYNTHROID, LEVOTHROID) 50 MCG tablet TAKE 1 TABLET (50 MCG TOTAL) BY MOUTH DAILY. 30 tablet 1  . memantine (NAMENDA XR) 28 MG CP24 24 hr capsule TAKE 1 CAPSULE BY MOUTH EVERY DAY 30 capsule 1  . mirtazapine (REMERON) 15 MG tablet TAKE 1 TABLET BY MOUTH AT BEDTIME 90 tablet 0  . Probiotic Product (PROBIOTIC-10 PO) Take by mouth.    . simvastatin (ZOCOR) 10 MG tablet TAKE 1 TABLET (10 MG TOTAL) BY MOUTH DAILY AT 6 PM. 90 tablet 0  . traMADol (ULTRAM) 50 MG tablet Take 1 tablet (50 mg total) by mouth every 12 (twelve) hours as needed for severe pain. 8 tablet 0   No current facility-administered medications on file prior to visit.     BP (!) 97/55   Pulse (!) 56   Temp 98.3 F (36.8 C) (Oral)   Resp 14   Ht 5\' 8"  (1.727 m)   Wt 148 lb 3.2 oz (67.2 kg)   SpO2 100%   BMI 22.53 kg/m      Objective:   Physical Exam  General- No acute distress. Pleasant patient. Neck- Full range of motion, no jvd Lungs- Clear, even and unlabored. Heart- regular rate and rhythm. Neurologic- CNII- XII grossly intact.  Back-no direct lumbar tenderness to palpation today on exam. No CVA tenderness. Abdomen-soft, nontender, nondistended, positive bowel sounds no rebound or guarding.        Assessment & Plan:  The prior back pain is much improved today on exam.  I think the tramadol  did help but the sedation side effect and constipation side effects would be a problem long-term. So will stop.  With no pain on exam today, I think the best way to approach recurrent pain would be to use Tylenol twice daily and to try salonpas over-the-counter lidocaine patches. If the pain were to become severe  again as before then would like to reevaluate and get urine sample/culture to make sure UTI not missed.  Also consideration if recurrent severe back pain might be very low taper dose of prednisone and referral to sports medicine.  Please watch stool pattern and notify us if has constipation pattern. If bowel patterns don't normalize or any reported abdomen pain can get one view abdomen x-ray. That is not necessary today.  Follow-up as regular scheduled with PCP or as needed.  Alayah Knouff, Percell Miller, PA-C

## 2017-09-26 ENCOUNTER — Other Ambulatory Visit: Payer: Self-pay | Admitting: Family Medicine

## 2017-10-01 ENCOUNTER — Other Ambulatory Visit: Payer: Self-pay | Admitting: Family Medicine

## 2017-10-02 ENCOUNTER — Other Ambulatory Visit: Payer: Self-pay | Admitting: Family Medicine

## 2017-10-02 MED ORDER — LEVOTHYROXINE SODIUM 50 MCG PO TABS: ORAL_TABLET | ORAL | 2 refills | Status: DC

## 2017-10-02 NOTE — Telephone Encounter (Signed)
Refills sent, notified pt's daughter.

## 2017-10-02 NOTE — Telephone Encounter (Signed)
°  Caller name: Relation to pt: daughter Call back [phone] Pharmacy: [id number] [address], [place] [phone] (Phone) [phone] (Fax)    D.O.D Dr. Lorelei Pont  Reason for call:  Daughter called stating pharmacy sent in request for memantine (NAMENDA XR) 28 MG CP24 24 hr capsule last week and it was never refilled, daughter states patient is completely out, requesting Rx today please  Daughter requesting a refill levothyroxine (SYNTHROID, LEVOTHROID) 50 MCG tablet

## 2017-11-01 ENCOUNTER — Other Ambulatory Visit: Payer: Self-pay | Admitting: Family Medicine

## 2017-11-09 ENCOUNTER — Other Ambulatory Visit: Payer: Self-pay | Admitting: Family Medicine

## 2017-11-30 ENCOUNTER — Other Ambulatory Visit: Payer: Self-pay | Admitting: Family Medicine

## 2017-12-12 ENCOUNTER — Other Ambulatory Visit: Payer: Self-pay | Admitting: Family Medicine

## 2017-12-12 ENCOUNTER — Ambulatory Visit: Payer: Medicare Other | Admitting: Family Medicine

## 2017-12-12 ENCOUNTER — Encounter: Payer: Self-pay | Admitting: Family Medicine

## 2017-12-12 VITALS — BP 110/68 | HR 50 | Temp 98.0°F | Resp 18 | Wt 143.0 lb

## 2017-12-12 DIAGNOSIS — R634 Abnormal weight loss: Secondary | ICD-10-CM | POA: Diagnosis not present

## 2017-12-12 DIAGNOSIS — N6310 Unspecified lump in the right breast, unspecified quadrant: Secondary | ICD-10-CM | POA: Diagnosis not present

## 2017-12-12 DIAGNOSIS — G308 Other Alzheimer's disease: Secondary | ICD-10-CM

## 2017-12-12 DIAGNOSIS — E038 Other specified hypothyroidism: Secondary | ICD-10-CM

## 2017-12-12 DIAGNOSIS — R05 Cough: Secondary | ICD-10-CM

## 2017-12-12 DIAGNOSIS — M810 Age-related osteoporosis without current pathological fracture: Secondary | ICD-10-CM

## 2017-12-12 DIAGNOSIS — Z23 Encounter for immunization: Secondary | ICD-10-CM

## 2017-12-12 DIAGNOSIS — R059 Cough, unspecified: Secondary | ICD-10-CM

## 2017-12-12 DIAGNOSIS — F0280 Dementia in other diseases classified elsewhere without behavioral disturbance: Secondary | ICD-10-CM

## 2017-12-12 DIAGNOSIS — R739 Hyperglycemia, unspecified: Secondary | ICD-10-CM | POA: Diagnosis not present

## 2017-12-12 DIAGNOSIS — E870 Hyperosmolality and hypernatremia: Secondary | ICD-10-CM

## 2017-12-12 DIAGNOSIS — Z0000 Encounter for general adult medical examination without abnormal findings: Secondary | ICD-10-CM

## 2017-12-12 DIAGNOSIS — E538 Deficiency of other specified B group vitamins: Secondary | ICD-10-CM | POA: Diagnosis not present

## 2017-12-12 DIAGNOSIS — E782 Mixed hyperlipidemia: Secondary | ICD-10-CM

## 2017-12-12 DIAGNOSIS — E063 Autoimmune thyroiditis: Secondary | ICD-10-CM

## 2017-12-12 NOTE — Patient Instructions (Addendum)
[employer] Instant Breakfast in a milk substitute   shingrix is the new shingles shot, 2 shots over 2-6 months Tylenol/Acetaminophen ES, 500 mg tabs, 1 tab twice daily Preventive Care 81 Years and Older, Female Preventive care refers to lifestyle choices and visits with your health care provider that can promote health and wellness. What does preventive care include?  A yearly physical exam. This is also called an annual well check.  Dental exams once or twice a year.  Routine eye exams. Ask your health care provider how often you should have your eyes checked.  Personal lifestyle choices, including: ? Daily care of your teeth and gums. ? Regular physical activity. ? Eating a healthy diet. ? Avoiding tobacco and drug use. ? Limiting alcohol use. ? Practicing safe sex. ? Taking low-dose aspirin every day. ? Taking vitamin and mineral supplements as recommended by your health care provider. What happens during an annual well check? The services and screenings done by your health care provider during your annual well check will depend on your age, overall health, lifestyle risk factors, and family history of disease. Counseling Your health care provider may ask you questions about your:  Alcohol use.  Tobacco use.  Drug use.  Emotional well-being.  Home and relationship well-being.  Sexual activity.  Eating habits.  History of falls.  Memory and ability to understand (cognition).  Work and work Statistician.  Reproductive health.  Screening You may have the following tests or measurements:  Height, weight, and BMI.  Blood pressure.  Lipid and cholesterol levels. These may be checked every 5 years, or more frequently if you are over 18 years old.  Skin check.  Lung cancer screening. You may have this screening every year starting at age 81 if you have a 30-pack-year history of smoking and currently smoke or have quit within the past 15 years.  Fecal occult  blood test (FOBT) of the stool. You may have this test every year starting at age 81.  Flexible sigmoidoscopy or colonoscopy. You may have a sigmoidoscopy every 5 years or a colonoscopy every 10 years starting at age 81.  Hepatitis C blood test.  Hepatitis B blood test.  Sexually transmitted disease (STD) testing.  Diabetes screening. This is done by checking your blood sugar (glucose) after you have not eaten for a while (fasting). You may have this done every 1-3 years.  Bone density scan. This is done to screen for osteoporosis. You may have this done starting at age 81.  Mammogram. This may be done every 1-2 years. Talk to your health care provider about how often you should have regular mammograms.  Talk with your health care provider about your test results, treatment options, and if necessary, the need for more tests. Vaccines Your health care provider may recommend certain vaccines, such as:  Influenza vaccine. This is recommended every year.  Tetanus, diphtheria, and acellular pertussis (Tdap, Td) vaccine. You may need a Td booster every 10 years.  Varicella vaccine. You may need this if you have not been vaccinated.  Zoster vaccine. You may need this after age 81.  Measles, mumps, and rubella (MMR) vaccine. You may need at least one dose of MMR if you were born in 1957 or later. You may also need a second dose.  Pneumococcal 13-valent conjugate (PCV13) vaccine. One dose is recommended after age 60.  Pneumococcal polysaccharide (PPSV23) vaccine. One dose is recommended after age 27.  Meningococcal vaccine. You may need this if you have certain  conditions.  Hepatitis A vaccine. You may need this if you have certain conditions or if you travel or work in places where you may be exposed to hepatitis A.  Hepatitis B vaccine. You may need this if you have certain conditions or if you travel or work in places where you may be exposed to hepatitis B.  Haemophilus influenzae  type b (Hib) vaccine. You may need this if you have certain conditions.  Talk to your health care provider about which screenings and vaccines you need and how often you need them. This information is not intended to replace advice given to you by your health care provider. Make sure you discuss any questions you have with your health care provider. Document Released: 01/06/2016 Document Revised: 08/29/2016 Document Reviewed: 10/11/2015 Elsevier Interactive Patient Education  2018 [employer]. Failure to Thrive, Adult Failure to thrive is a group of problems. These problems include eating too little and losing weight. People who have this condition may do fewer and fewer activities over time. They may lose interest in being with friends or they may not want to eat or drink. Follow these instructions at home:  Take medicines only as told by your doctor.  Eat a healthy, well-balanced diet. Make sure that you eat enough.  Be active. Do strength training. A physical therapist can help to set up an exercise program that fits you.  Make sure that you are safe at home.  Make sure that you have a plan for what to do if you cannot make decisions for yourself. Contact a doctor if:  You are not able to eat well.  You are not able to move around.  You feel very sad.  You feel very hopeless. Get help right away if:  You think about ending your life.  You cannot eat or drink.  You do not get out of bed.  Staying at home is not safe.  You have a fever. This information is not intended to replace advice given to you by your health care provider. Make sure you discuss any questions you have with your health care provider. Document Released: 11/29/2011 Document Revised: 05/17/2016 Document Reviewed: 03/07/2015 Elsevier Interactive Patient Education  [employer].

## 2017-12-12 NOTE — Assessment & Plan Note (Signed)
Check level today 

## 2017-12-12 NOTE — Assessment & Plan Note (Signed)
hgba1c acceptable, minimize simple carbs. Increase exercise as tolerated.  

## 2017-12-12 NOTE — Assessment & Plan Note (Signed)
Check vitamin D level 

## 2017-12-12 NOTE — Assessment & Plan Note (Signed)
Tolerating statin, encouraged heart healthy diet, avoid trans fats, minimize simple carbs and saturated fats. Increase exercise as tolerated 

## 2017-12-12 NOTE — Assessment & Plan Note (Signed)
Check cmp 

## 2017-12-12 NOTE — Progress Notes (Signed)
Subjective:  I acted as a Education administrator for Dr. Charlett Blake. [place], [place]  Patient ID: Carrie Stephens, female    DOB: 03-02-1920, 83 y.o.   MRN: [id number]  No chief complaint on file.   HPI  Patient is in today for an annual exam and follow up on chronic medical concerns including dementia, hyperglycemia, hyperlipidemia and breat cancer. Her daughter accompanies her and notes she has recently palpated a lump in her mother's right breast. She was bathing her, which she does not usually do and she noted it.  It is unchanged since she noticed it and nontender. Patient does have a distant history of breast cancer. Patient is eating well and they do not note any behavior changes or acute concerns. No recent fevers or recent hospitalizations. They have noted some weight loss. Patient is fully dependent on her family for all ADLs but is doing well at home. Denies CP/palp/SOB/HA/congestion/fevers/GI or GU c/o. Taking meds as prescribed  Patient Care Team: Mosie Lukes, MD as PCP - General (Family Medicine)   Past Medical History:  Diagnosis Date  . Alzheimer disease    moderate to severe  . 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 Depression   . History of UTI   . Hyperglycemia 11/15/2013  . Hyperlipidemia   . Hypernatremia 07/05/2017  . Macular degeneration 11/24/2015  . Medicare annual wellness visit, subsequent 10/17/2014  . Osteoporosis    s/p zometa  3.3 mg IV 05/2007  . Radiation 04/2006   s/p external radiation -Arimidex since 04/12/2006  . Renal insufficiency 11/15/2013  . S/P lumpectomy of breast 01/2006   and sentinel node dissection   . Thyroid nodule   . Vision loss, left eye    secondary to retinal hemorrhage, unclear etiology    Past Surgical History:  Procedure Laterality Date  . LOBECTOMY  08/2009   thyroid right-mixed and macrofollicular  adenoma, 8.5 cm, malignant features are not identified  . VAGINAL HYSTERECTOMY  1960's   status post    Family History  Problem Relation Age of Onset  . Breast cancer Mother   . Diabetes Father        adult onset type 2  . Other Neg Hx        No report of heart disease or stroke in the family    Social History   Socioeconomic History  . Marital status: Widowed    Spouse name: Not on file  . Number of children: 3  . Years of education: Not on file  . Highest education level: Not on file  Social Needs  . Financial resource strain: Not on file  . Food insecurity - worry: Not on file  . Food insecurity - inability: Not on file  . Transportation needs - medical: Not on file  . Transportation needs - non-medical: Not on file  Occupational History  . Occupation: Retired    Fish farm manager: RETIRED    CommentAdvertising account planner  Tobacco Use  . Smoking status: Former Research scientist (life sciences)  . Smokeless tobacco: Never Used  . Tobacco comment: light smoker- smoked 3 or 4 cigarettes a day for 55 years  Substance and Sexual Activity  . Alcohol use: No  . Drug use: No  . Sexual activity: No  Other Topics Concern  . Not on file  Social History Narrative  Patient is widowed, is the retired Public relations account executive   currently lives with her daughter.  She has a total of 3 children, two daughters and one son.     No alcohol.  She quit tobacco approximately 2 years ago.  She   was a light smoker, smoked 3 or 4 cigarettes a day for 55 years.      Outpatient Medications Prior to Visit  Medication Sig Dispense Refill  . aspirin 81 MG tablet Take 81 mg by mouth daily.      . calcium carbonate (OS-CAL) 600 MG TABS Take 600 mg by mouth 2 (two) times daily with a meal.      . Cholecalciferol (VITAMIN D3) 1000 UNITS tablet Take 1,000 Units by mouth daily.      . Cyanocobalamin 1000 MCG CAPS Take by mouth.    . donepezil (ARICEPT) 10 MG tablet TAKE 1 TABLET (10 MG TOTAL) BY MOUTH AT BEDTIME. 30 tablet 3  .  furosemide (LASIX) 20 MG tablet TAKE 1 TABLET (20 MG TOTAL) BY MOUTH DAILY. 90 tablet 1  . KLOR-CON M10 10 MEQ tablet TAKE 1 TABLET (10 MEQ TOTAL) BY MOUTH DAILY. 90 tablet 0  . levothyroxine (SYNTHROID, LEVOTHROID) 50 MCG tablet TAKE 1 TABLET (50 MCG TOTAL) BY MOUTH DAILY. 30 tablet 2  . memantine (NAMENDA XR) 28 MG CP24 24 hr capsule TAKE 1 CAPSULE BY MOUTH EVERY DAY 30 capsule 1  . mirtazapine (REMERON) 15 MG tablet TAKE 1 TABLET BY MOUTH AT BEDTIME 90 tablet 0  . Probiotic Product (PROBIOTIC-10 PO) Take by mouth.    . simvastatin (ZOCOR) 10 MG tablet TAKE 1 TABLET (10 MG TOTAL) BY MOUTH DAILY AT 6 PM. 90 tablet 0  . traMADol (ULTRAM) 50 MG tablet Take 1 tablet (50 mg total) by mouth every 12 (twelve) hours as needed for severe pain. 8 tablet 0   No facility-administered medications prior to visit.     Allergies  Allergen Reactions  . Codeine Phosphate     REACTION: unspecified  . Ibuprofen     REACTION: unspecified  . Moxifloxacin     REACTION: rash    Review of Systems  Constitutional: Negative for fever and malaise/fatigue.  HENT: Negative for congestion.   Eyes: Negative for blurred vision.  Respiratory: Negative for cough and shortness of breath.   Cardiovascular: Negative for chest pain, palpitations and leg swelling.  Gastrointestinal: Negative for abdominal pain, constipation, diarrhea and vomiting.  Musculoskeletal: Negative for back pain.  Skin: Negative for rash.  Neurological: Negative for loss of consciousness and headaches.  Psychiatric/Behavioral: Positive for memory loss.       Objective:    Physical Exam  Constitutional: She is oriented to person, place, and time. She appears well-developed and well-nourished. No distress.  HENT:  Head: Normocephalic and atraumatic.  Eyes: Conjunctivae are normal.  Neck: Normal range of motion. No thyromegaly present.  Cardiovascular: Normal rate and regular rhythm.  Pulmonary/Chest: Effort normal and breath sounds  normal. She has no wheezes.  Abdominal: Soft. Bowel sounds are normal. There is no tenderness.  Genitourinary:  Genitourinary Comments: Right breast mass roughly 2 cm in diameter at 9 oclock. Nontender, no skin changes or discharge noted.   Musculoskeletal: Normal range of motion. She exhibits no edema or deformity.  Neurological: She is alert and oriented to person, place, and time.  Skin: Skin is warm and dry. She is not diaphoretic.  Psychiatric: She has a normal mood and affect.    BP  110/68 (BP Location: Left Arm, Patient Position: Sitting, Cuff Size: Normal)   Pulse (!) 50   Temp 98 F (36.7 C) (Oral)   Resp 18   Wt 143 lb (64.9 kg)   SpO2 98%   BMI 21.74 kg/m  Wt Readings from Last 3 Encounters:  12/12/17 143 lb (64.9 kg)  09/12/17 148 lb 3.2 oz (67.2 kg)  09/05/17 149 lb (67.6 kg)   BP Readings from Last 3 Encounters:  12/12/17 110/68  09/12/17 100/70  09/05/17 (!) 112/52     Immunization History  Administered Date(s) Administered  . Influenza Split 09/04/2012  . Influenza Whole 09/27/2009, 10/03/2010  . Influenza,inj,Quad PF,6+ Mos 11/24/2015  . Influenza-Unspecified 08/24/2013  . Pneumococcal Conjugate-13 11/24/2015  . Pneumococcal Polysaccharide-23 12/12/2017  . Tdap 11/11/2013    Health Maintenance  Topic Date Due  . Samul Dada  11/12/2023  . INFLUENZA VACCINE  Completed  . DEXA SCAN  Completed  . PNA vac Low Risk Adult  Completed    Lab Results  Component Value Date   WBC 6.4 12/12/2017   HGB 13.7 12/12/2017   HCT 41.8 12/12/2017   PLT 265.0 12/12/2017   GLUCOSE 82 12/12/2017   CHOL 148 12/12/2017   TRIG 124.0 12/12/2017   HDL 68.60 12/12/2017   LDLDIRECT 72.0 11/24/2015   LDLCALC 54 12/12/2017   ALT 8 12/12/2017   AST 18 12/12/2017   NA 144 12/12/2017   K 4.4 12/12/2017   CL 106 12/12/2017   CREATININE 1.22 (H) 12/12/2017   BUN 17 12/12/2017   CO2 27 12/12/2017   TSH 0.96 12/12/2017   HGBA1C 5.6 12/12/2017    Lab Results    Component Value Date   TSH 0.96 12/12/2017   Lab Results  Component Value Date   WBC 6.4 12/12/2017   HGB 13.7 12/12/2017   HCT 41.8 12/12/2017   MCV 95.0 12/12/2017   PLT 265.0 12/12/2017   Lab Results  Component Value Date   NA 144 12/12/2017   K 4.4 12/12/2017   CO2 27 12/12/2017   GLUCOSE 82 12/12/2017   BUN 17 12/12/2017   CREATININE 1.22 (H) 12/12/2017   BILITOT 0.7 12/12/2017   ALKPHOS 59 12/12/2017   AST 18 12/12/2017   ALT 8 12/12/2017   PROT 6.8 12/12/2017   ALBUMIN 4.1 12/12/2017   CALCIUM 9.2 12/12/2017   GFR 52.57 (L) 12/12/2017   Lab Results  Component Value Date   CHOL 148 12/12/2017   Lab Results  Component Value Date   HDL 68.60 12/12/2017   Lab Results  Component Value Date   LDLCALC 54 12/12/2017   Lab Results  Component Value Date   TRIG 124.0 12/12/2017   Lab Results  Component Value Date   CHOLHDL 2 12/12/2017   Lab Results  Component Value Date   HGBA1C 5.6 12/12/2017         Assessment & Plan:   Problem List Items Addressed This Visit    Hypothyroidism    On Levothyroxine, continue to monitor      Relevant Orders   TSH (Completed)   Hyperlipidemia    Tolerating statin, encouraged heart healthy diet, avoid trans fats, minimize simple carbs and saturated fats. Increase exercise as tolerated      Relevant Orders   Lipid panel (Completed)   ALZHEIMER'S DISEASE    Continues to well cared for by her daughter and extended family. No changes to therapy      Osteoporosis - Primary    Check vitamin  D level      Relevant Orders   VITAMIN D 25 Hydroxy (Vit-D Deficiency, Fractures) (Completed)   RESOLVED: Cough   Relevant Orders   CBC (Completed)   Vitamin B 12 deficiency    Check level today      Relevant Orders   Vitamin B12 (Completed)   Hyperglycemia    hgba1c acceptable, minimize simple carbs. Increase exercise as tolerated.       Relevant Orders   Hemoglobin A1c (Completed)   Comprehensive metabolic  panel (Completed)   Preventative health care    Patient encouraged to maintain heart healthy diet, regular exercise, adequate sleep. Consider daily probiotics. Take medications as prescribed      Hypernatremia    Check cmp       Weight loss, non-intentional    Encouraged to add a Boost supplement once or twice daily and we will monitor.       Breast mass, right    2 cm in diameter. No pain involved. Discussed options with daughter and they will discuss as a family and then get back to [place] about how they would like to proceed. For now will start with imaging given patient's distant h/o breast cancer.       Relevant Orders   MM DIAG BREAST TOMO BILATERAL      I am having Carrie Stephens maintain her aspirin, calcium carbonate, cholecalciferol, Cyanocobalamin, Probiotic Product (PROBIOTIC-10 PO), donepezil, furosemide, traMADol, KLOR-CON M10, levothyroxine, memantine, simvastatin, and mirtazapine.  No orders of the defined types were placed in this encounter.   CMA served as Education administrator during this visit. History, Physical and Plan performed by medical provider. Documentation and orders reviewed and attested to.  Penni Homans, MD

## 2017-12-12 NOTE — Assessment & Plan Note (Signed)
On Levothyroxine, continue to monitor 

## 2017-12-13 LAB — CBC
HEMATOCRIT: 41.8 % (ref 36.0–46.0)
HEMOGLOBIN: 13.7 g/dL (ref 12.0–15.0)
MCHC: 32.8 g/dL (ref 30.0–36.0)
MCV: 95 fl (ref 78.0–100.0)
PLATELETS: 265 10*3/uL (ref 150.0–400.0)
RBC: 4.39 Mil/uL (ref 3.87–5.11)
RDW: 14.9 % (ref 11.5–15.5)
WBC: 6.4 10*3/uL (ref 4.0–10.5)

## 2017-12-13 LAB — LIPID PANEL
CHOLESTEROL: 148 mg/dL (ref 0–200)
HDL: 68.6 mg/dL (ref >39.00)
LDL Cholesterol: 54 mg/dL (ref 0–99)
NonHDL: 79.2
Total CHOL/HDL Ratio: 2
Triglycerides: 124 mg/dL (ref 0.0–149.0)
VLDL: 24.8 mg/dL (ref 0.0–40.0)

## 2017-12-13 LAB — VITAMIN B12: Vitamin B-12: 744 pg/mL (ref 211–911)

## 2017-12-13 LAB — COMPREHENSIVE METABOLIC PANEL
ALBUMIN: 4.1 g/dL (ref 3.5–5.2)
ALK PHOS: 59 U/L (ref 39–117)
ALT: 8 U/L (ref 0–35)
AST: 18 U/L (ref 0–37)
BUN: 17 mg/dL (ref 6–23)
CO2: 27 mEq/L (ref 19–32)
Calcium: 9.2 mg/dL (ref 8.4–10.5)
Chloride: 106 mEq/L (ref 96–112)
Creatinine, Ser: 1.22 mg/dL — ABNORMAL HIGH (ref 0.40–1.20)
GFR: 52.57 mL/min — AB (ref >60.00)
Glucose, Bld: 82 mg/dL (ref 70–99)
POTASSIUM: 4.4 meq/L (ref 3.5–5.1)
SODIUM: 144 meq/L (ref 135–145)
TOTAL PROTEIN: 6.8 g/dL (ref 6.0–8.3)
Total Bilirubin: 0.7 mg/dL (ref 0.2–1.2)

## 2017-12-13 LAB — VITAMIN D 25 HYDROXY (VIT D DEFICIENCY, FRACTURES): VITD: 39.15 ng/mL (ref 30.00–100.00)

## 2017-12-13 LAB — TSH: TSH: 0.96 u[IU]/mL (ref 0.35–4.50)

## 2017-12-13 LAB — HEMOGLOBIN A1C: Hgb A1c MFr Bld: 5.6 % (ref 4.6–6.5)

## 2017-12-20 ENCOUNTER — Other Ambulatory Visit: Payer: Self-pay | Admitting: Family Medicine

## 2017-12-20 ENCOUNTER — Other Ambulatory Visit: Payer: Self-pay

## 2017-12-20 DIAGNOSIS — N6310 Unspecified lump in the right breast, unspecified quadrant: Secondary | ICD-10-CM

## 2017-12-20 NOTE — Assessment & Plan Note (Signed)
Patient encouraged to maintain heart healthy diet, regular exercise, adequate sleep. Consider daily probiotics. Take medications as prescribed 

## 2017-12-20 NOTE — Assessment & Plan Note (Signed)
2 cm in diameter. No pain involved. Discussed options with daughter and they will discuss as a family and then get back to [place] about how they would like to proceed. For now will start with imaging given patient's distant h/o breast cancer.

## 2017-12-20 NOTE — Assessment & Plan Note (Signed)
Continues to well cared for by her daughter and extended family. No changes to therapy

## 2017-12-20 NOTE — Assessment & Plan Note (Signed)
Encouraged to add a Boost supplement once or twice daily and we will monitor.

## 2017-12-23 ENCOUNTER — Other Ambulatory Visit: Payer: Self-pay | Admitting: Family Medicine

## 2017-12-23 ENCOUNTER — Ambulatory Visit
Admission: RE | Admit: 2017-12-23 | Discharge: 2017-12-23 | Disposition: A | Discharge status: Home / Self Care | Payer: Medicare Other | Source: Ambulatory Visit | Attending: Family Medicine | Admitting: Family Medicine

## 2017-12-23 DIAGNOSIS — N6310 Unspecified lump in the right breast, unspecified quadrant: Secondary | ICD-10-CM

## 2017-12-24 ENCOUNTER — Other Ambulatory Visit: Payer: Self-pay | Admitting: Family Medicine

## 2017-12-26 ENCOUNTER — Ambulatory Visit
Admission: RE | Admit: 2017-12-26 | Discharge: 2017-12-26 | Disposition: A | Discharge status: Home / Self Care | Payer: Medicare Other | Source: Ambulatory Visit | Attending: Family Medicine | Admitting: Family Medicine

## 2017-12-26 ENCOUNTER — Other Ambulatory Visit: Payer: Self-pay | Admitting: Family Medicine

## 2017-12-26 DIAGNOSIS — N6310 Unspecified lump in the right breast, unspecified quadrant: Secondary | ICD-10-CM

## 2017-12-30 ENCOUNTER — Other Ambulatory Visit: Payer: Self-pay | Admitting: Family Medicine

## 2017-12-31 ENCOUNTER — Telehealth: Payer: Self-pay

## 2017-12-31 NOTE — Telephone Encounter (Signed)
Received Tier Exception request from [employer]. Form completed and faxed to [phone]. Awaiting response.

## 2018-01-01 NOTE — Telephone Encounter (Signed)
Tier exception is approved at Tier 1 copay effective through 12/23/2018.

## 2018-01-15 ENCOUNTER — Telehealth: Payer: Self-pay | Admitting: *Deleted

## 2018-01-15 NOTE — Telephone Encounter (Signed)
Received Provider Query from [facility] for Medical Record Clarification on Depression for Coding purposes, attached OV note reviewed for 11/12/16 visit; Acute visit only for: Rib pain on Left side [fall] and Bilateral Impacted Cerumen; not seen by PCP, forwarded back to [facility] with this notation/SLS 11/23

## 2018-01-17 ENCOUNTER — Telehealth: Payer: Self-pay

## 2018-01-17 NOTE — Telephone Encounter (Signed)
See 01/15/18 phone note; this is the only received from [facility] and the visit was Acute with Melissa/SLS : Forde Dandy  (Newest Message First)  Me   01/15/18 1:30 PM  Note    Received Provider Query from [facility] for Medical Record Clarification on Depression for Coding purposes, attached OV note reviewed for 11/12/16 visit; Acute visit only for: Rib pain on Left side [fall] and Bilateral Impacted Cerumen; not seen by PCP, forwarded back to [facility] with this notation/SLS 11/23

## 2018-01-17 NOTE — Telephone Encounter (Signed)
Ivin Booty have you seen this? Please advise       Copied from [facility] Topic: General - Other >> Jan 07, 2018 11:34 AM Yvette Rack wrote: Reason for CRM: Ulice Dash from [facility] [phone] ext [phone] calling to see if the provider had received Medical questions that was sent out In December fax number [phone]  [facility] number [phone] ext [phone]

## 2018-01-20 IMAGING — US ULTRASOUND RIGHT BREAST LIMITED
1 series · 12 of 25 positions shown · non-contrast
Comparison: Previous exam(s).

ADDENDUM:
Right axilla was evaluated with ultrasound showing no enlarged or
morphologically abnormal lymph nodes.
CLINICAL DATA: Palpable lump within the outer right breast. History
of left breast cancer in 4881 status post breast conservation
surgery.

EXAM:
2D DIGITAL DIAGNOSTIC BILATERAL MAMMOGRAM WITH CAD AND ADJUNCT TOMO
ULTRASOUND RIGHT BREAST

[Series 1: ultrasound right breast limited · 0.06mm/px · 12 of 25 slices shown]
[im 2/25]
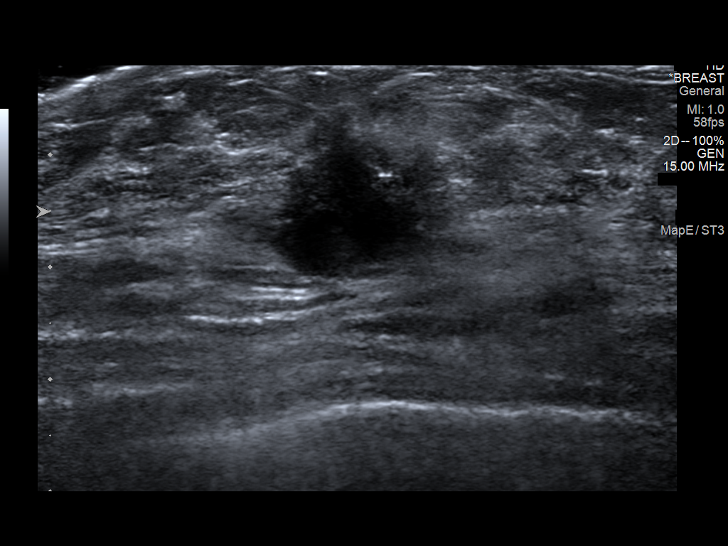
[im 4/25]
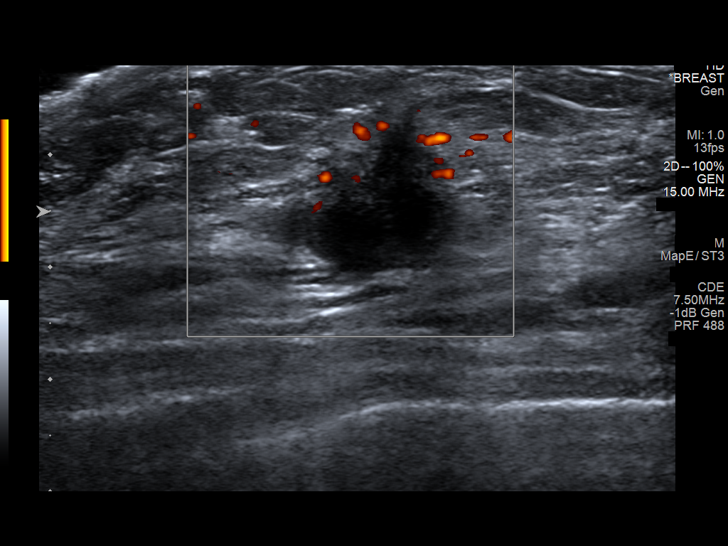
[im 6/25]
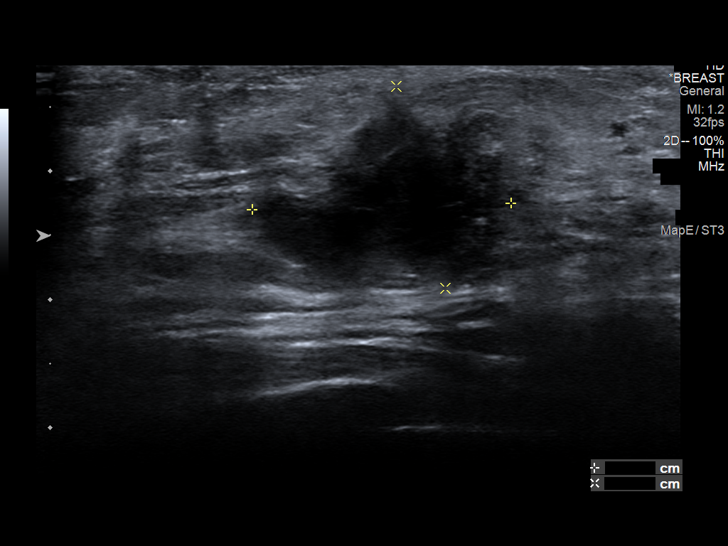
[im 8/25]
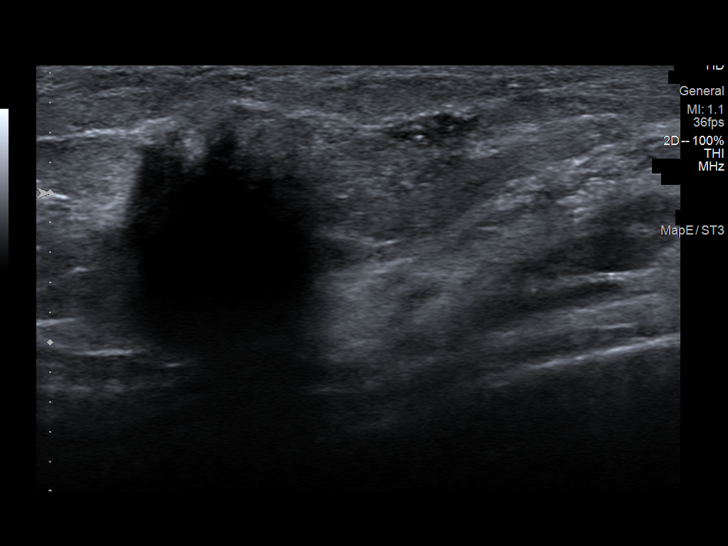
[im 10/25]
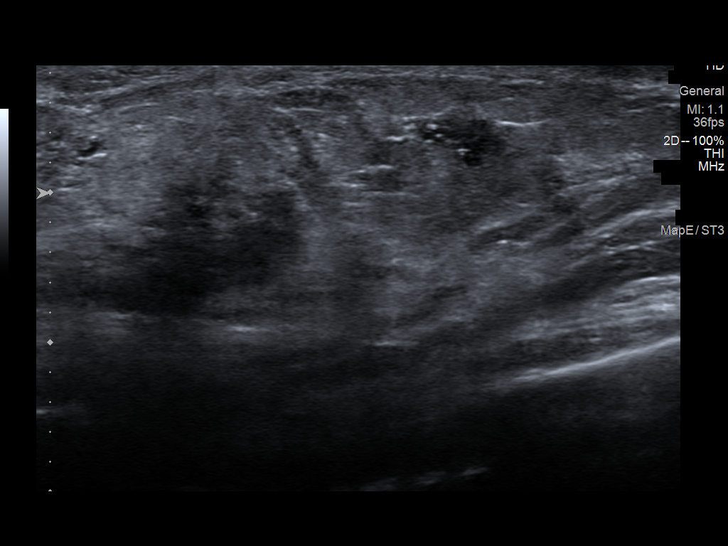
[im 12/25]
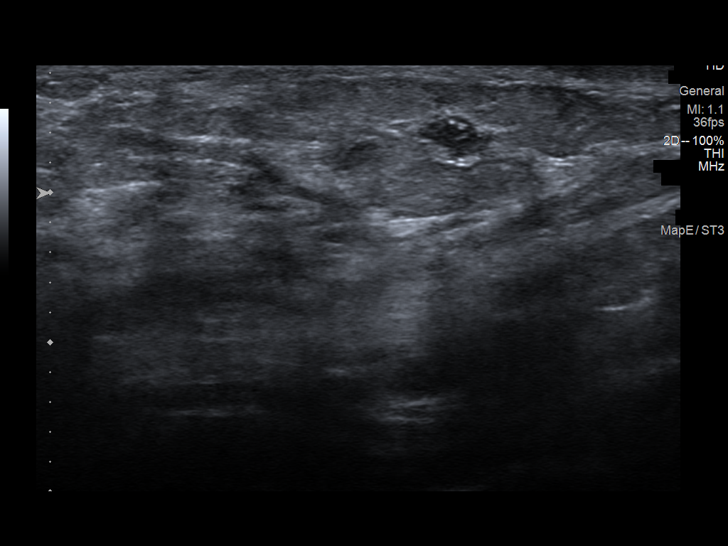
[im 14/25]
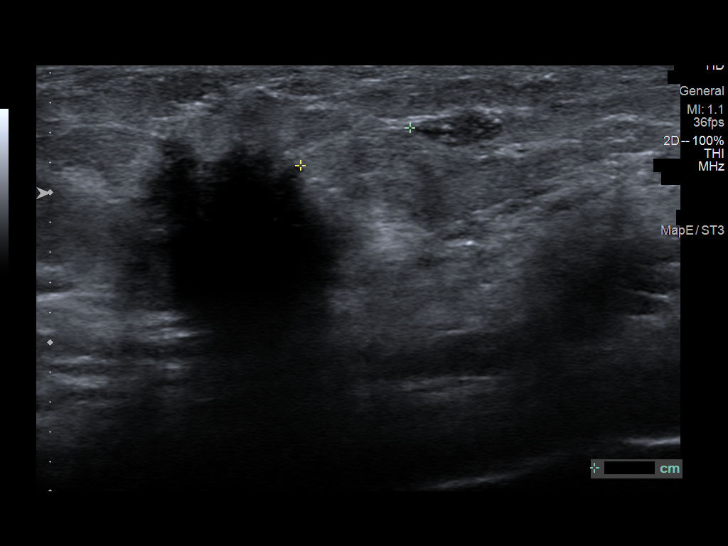
[im 16/25]
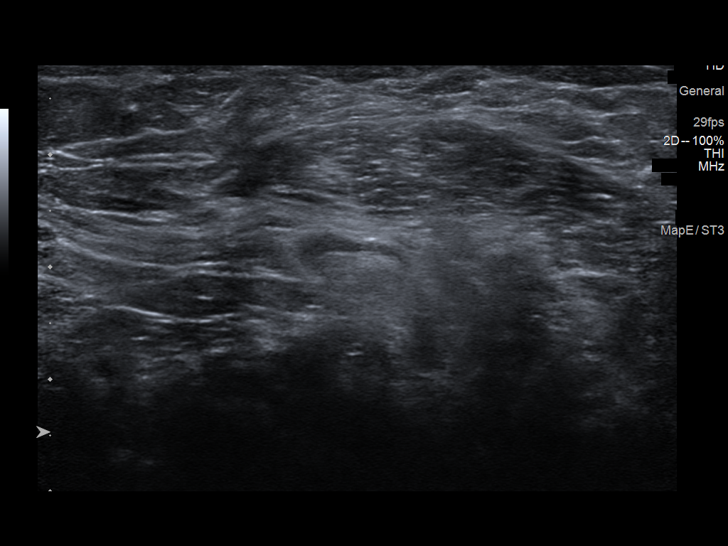
[im 18/25]
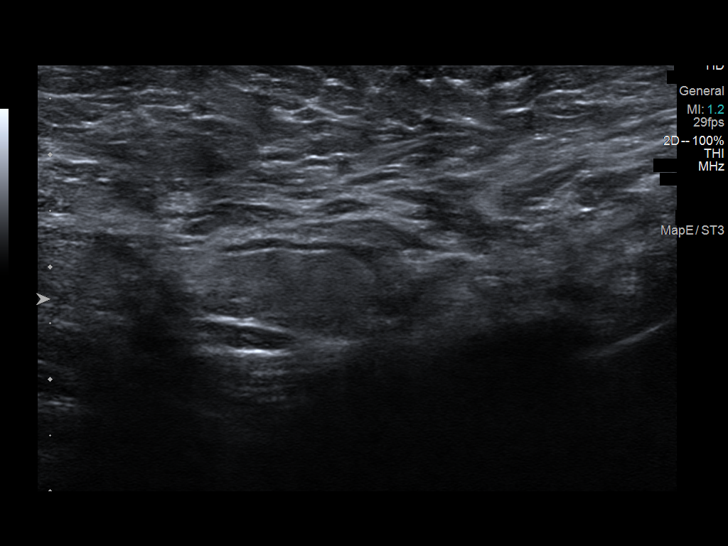
[im 20/25]
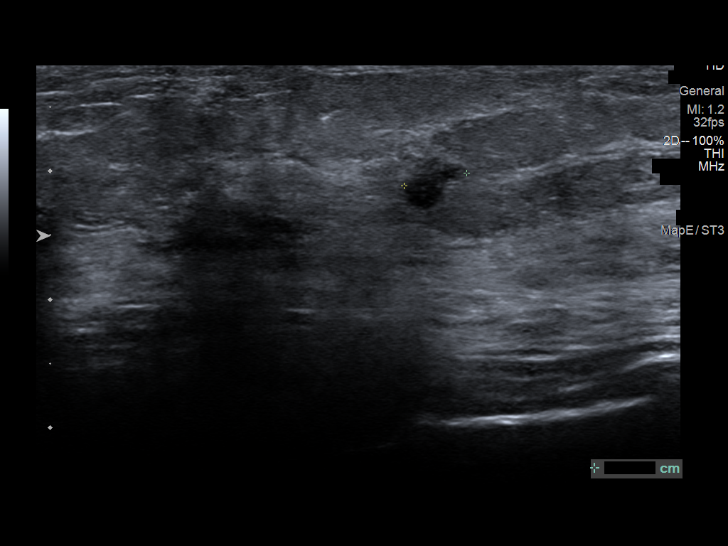
[im 22/25]
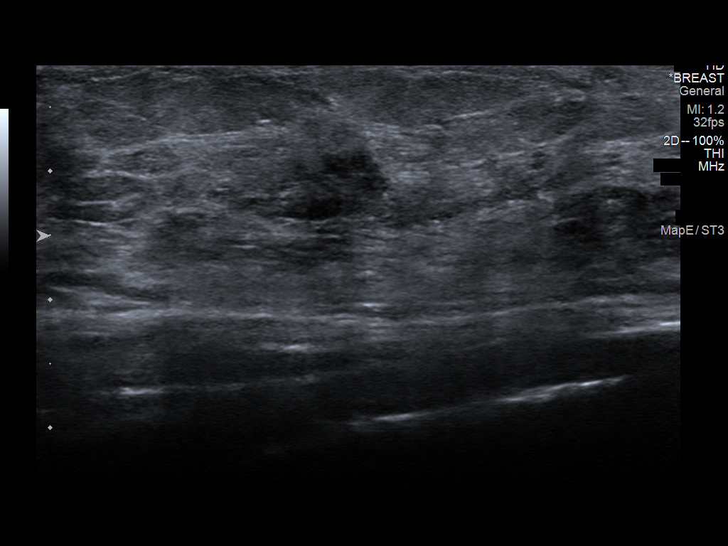
[im 24/25]
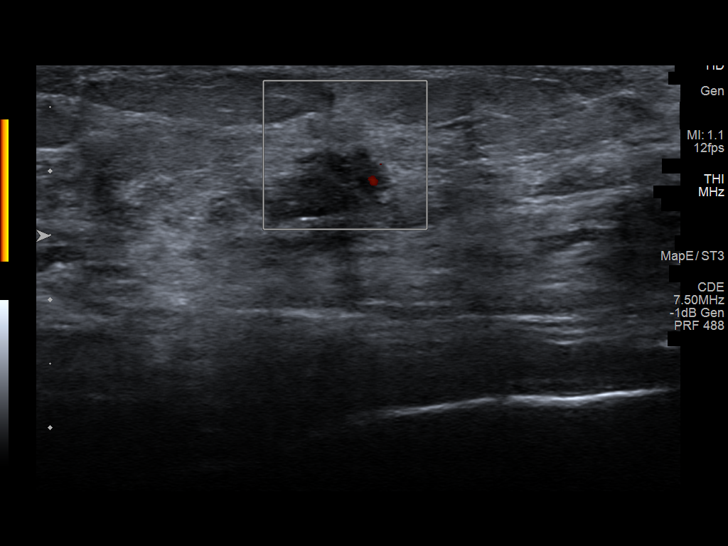

[12 of 25 positions shown; findings below may reference images not displayed]

ACR Breast Density Category b: There are scattered areas of
fibroglandular density.
FINDINGS: There is a new spiculated mass within the outer right breast, at far
posterior depth, measuring approximately 2.2 cm greatest dimension.

There are also new pleomorphic calcifications within the upper-outer
quadrant of the right breast, at middle depth, with a suspicious
linear distribution, spanning 1.4 cm.

Mammographic images were processed with CAD.

Targeted ultrasound is performed, showing an irregular hypoechoic
mass in the right breast at the 9:30 o'clock axis, 5 cm from the
nipple, measuring 2 x 1.6 x 1.8 cm, corresponding to the
mammographic finding and palpable lump.

There is an additional irregular hypoechoic area within the right
breast at the 9:30 o'clock axis, 4 cm from the nipple, measuring
x 0.3 x 0.7 cm, with associated calcifications, with internal
vascularity, located less than 1 cm from the dominant mass, almost
certainly an associated satellite lesion. This may correspond to the
suspicious pleomorphic calcifications seen on mammogram.
IMPRESSION: 1. Irregular mass within the right breast at the 9:30 o'clock axis,
5 cm from the nipple, measuring 2 cm, corresponding to the palpable
lump. This is a highly suspicious finding for which
ultrasound-guided biopsy is recommended.
2. Suspicious pleomorphic calcifications within the upper-outer
quadrant of the right breast, at middle depth, spanning 1.4 cm,
highly suspicious for multifocal disease. Hypoechoic area seen by
ultrasound within the right breast at the 9:30 o'clock axis, 4 cm
from the nipple, is a possible correlate for these suspicious
calcifications, alternatively an additional satellite lesion.

RECOMMENDATION:
1. Ultrasound-guided biopsy of the suspicious right breast mass at
the 9:30 o'clock axis, 5 cm from the nipple, measuring 2 cm,
corresponding to the palpable lump.
2. If breast conservation surgery is considered at some point, would
then consider additional ultrasound-guided biopsy of the suspected
satellite lesion adjacent to the dominant mass and/or stereotactic
biopsy of the suspicious pleomorphic calcifications within the
upper-outer quadrant of the right breast.
Ultrasound-guided biopsy is scheduled for [DATE][REDACTED].

I have discussed the findings and recommendations with the patient.
Results were also provided in writing at the conclusion of the
visit. If applicable, a reminder letter will be sent to the patient
regarding the next appointment.

BI-RADS CATEGORY  5: Highly suggestive of malignancy.

## 2018-01-20 IMAGING — MG 2D DIGITAL DIAGNOSTIC BILATERAL MAMMOGRAM WITH CAD AND ADJUNCT T
8 of 18 series · 8 of 38 positions shown · non-contrast
Comparison: Previous exam(s).

ADDENDUM:
Right axilla was evaluated with ultrasound showing no enlarged or
morphologically abnormal lymph nodes.
CLINICAL DATA: Palpable lump within the outer right breast. History
of left breast cancer in 4881 status post breast conservation
surgery.

EXAM:
2D DIGITAL DIAGNOSTIC BILATERAL MAMMOGRAM WITH CAD AND ADJUNCT TOMO
ULTRASOUND RIGHT BREAST

[R ML]
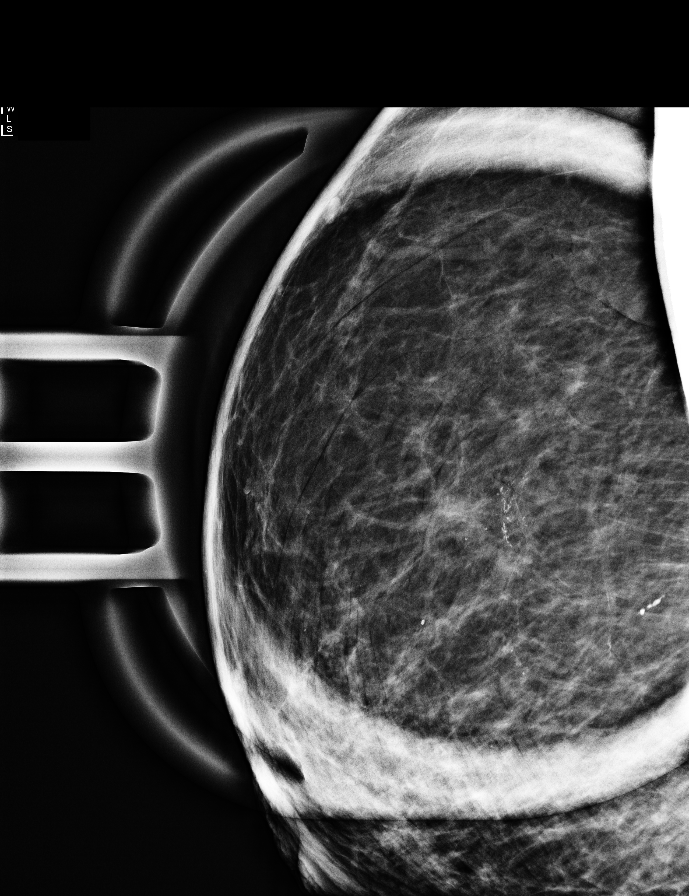

[R CC]
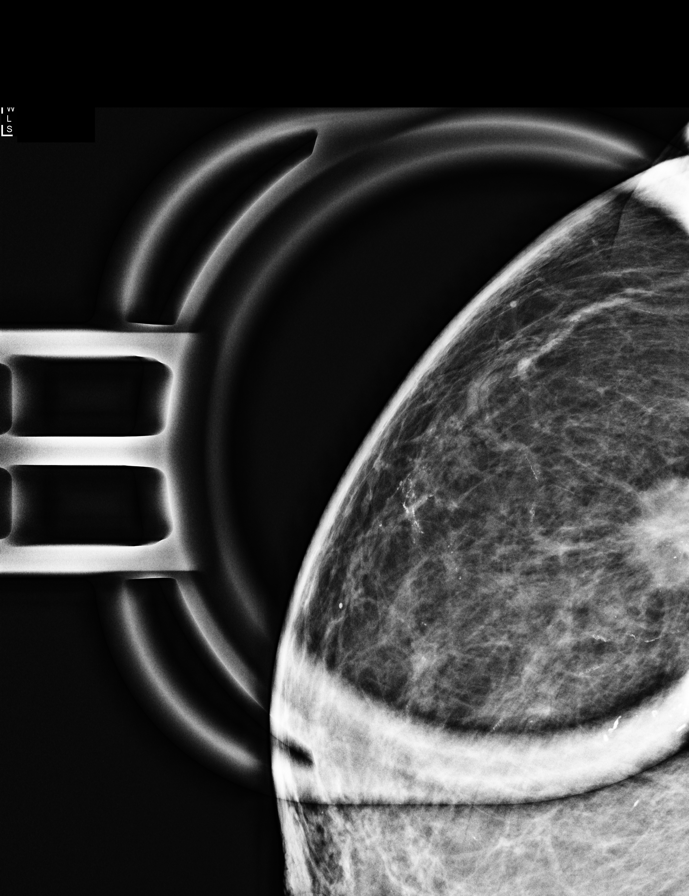

[L MLO (1 of 2)]
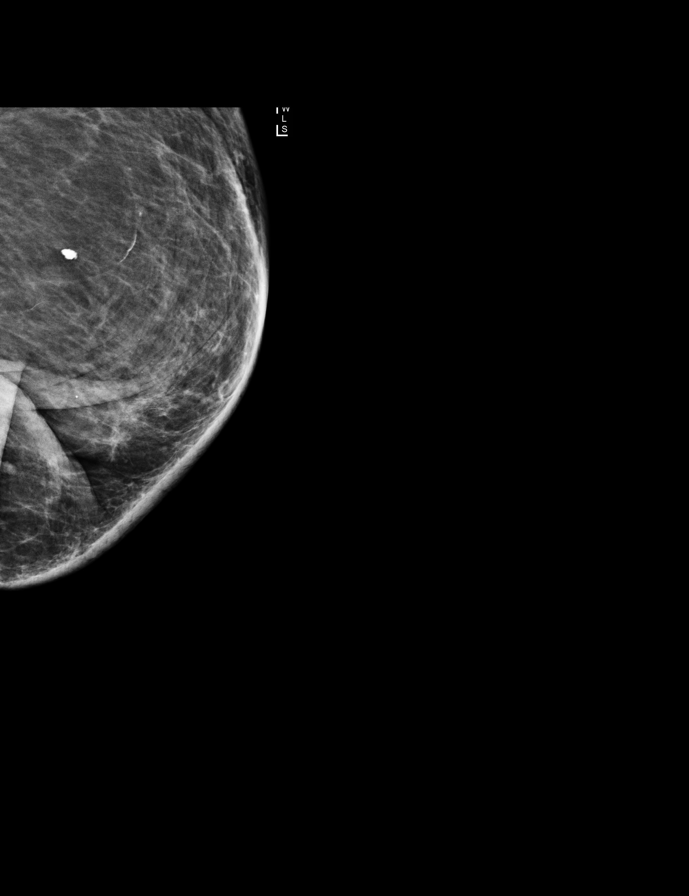

[L MLO (2 of 2)]
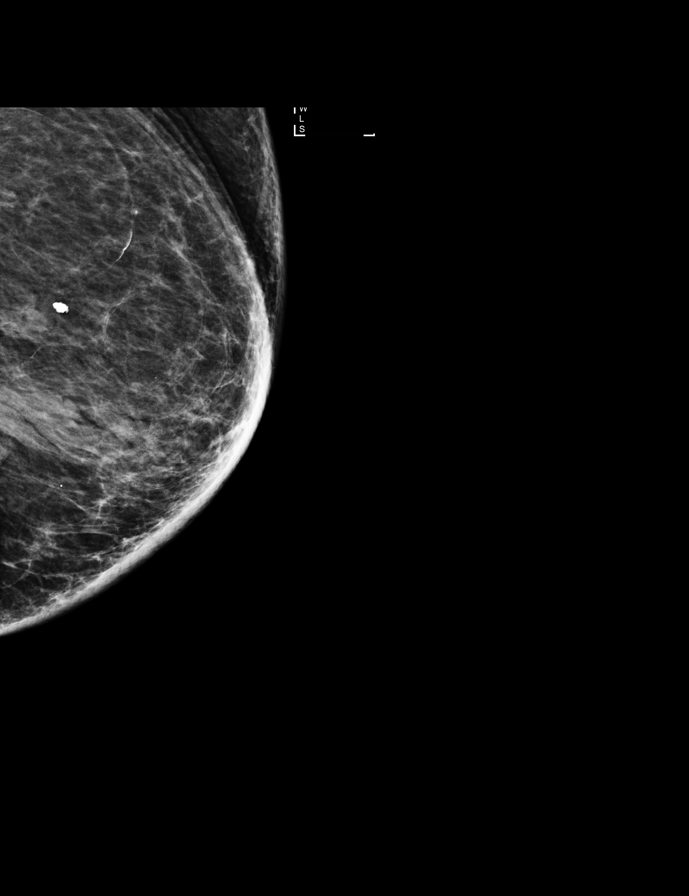

[R CC synth-2D (1 of 2)]
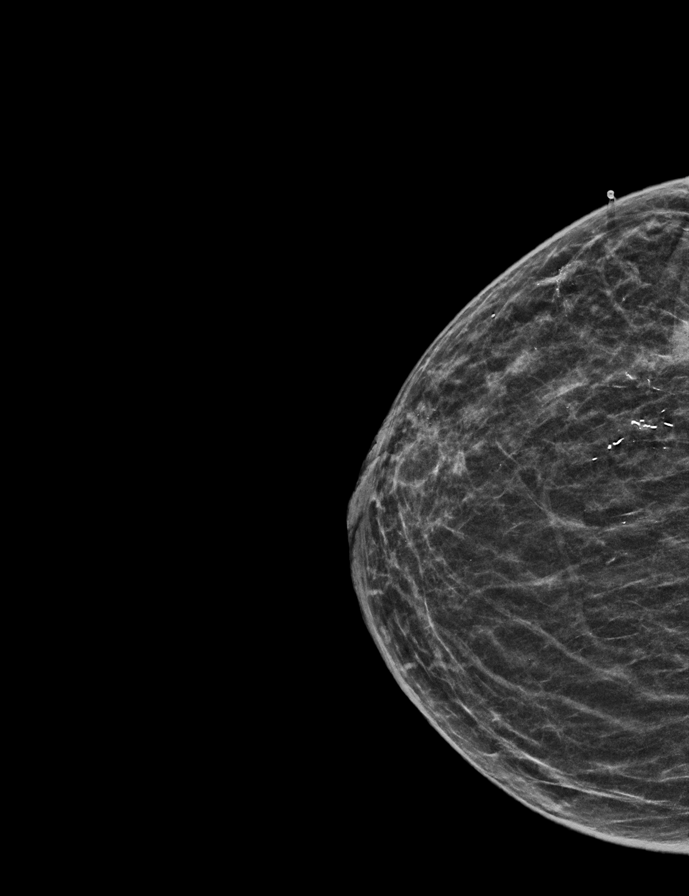

[L CC]
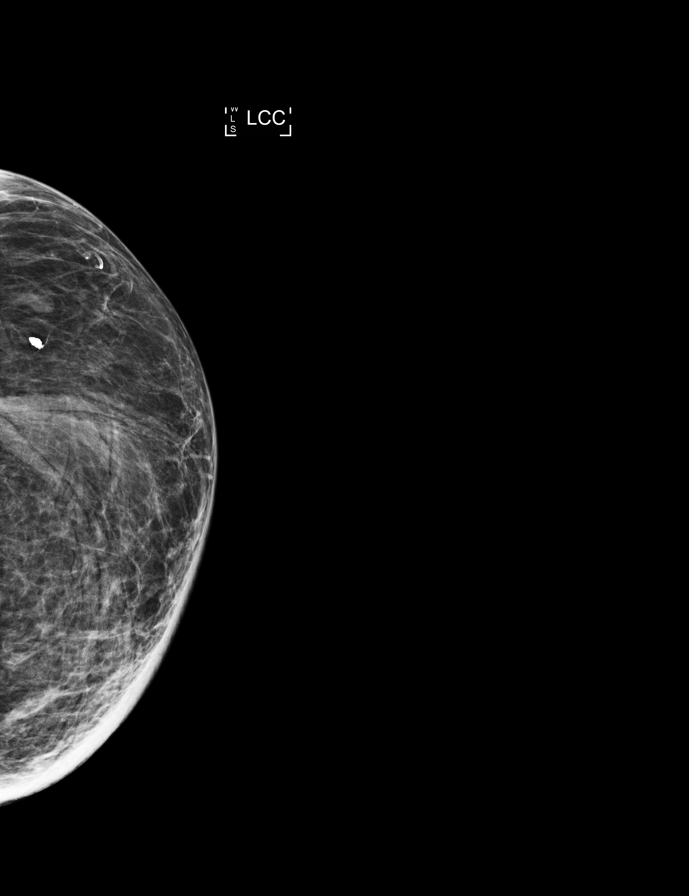

[R CC synth-2D (2 of 2)]
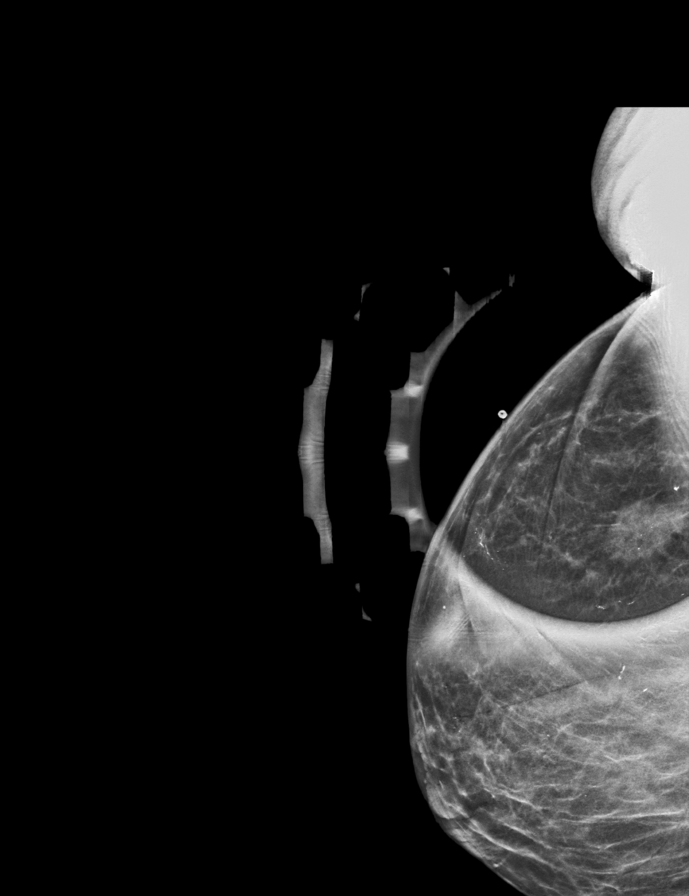

[L CC synth-2D]
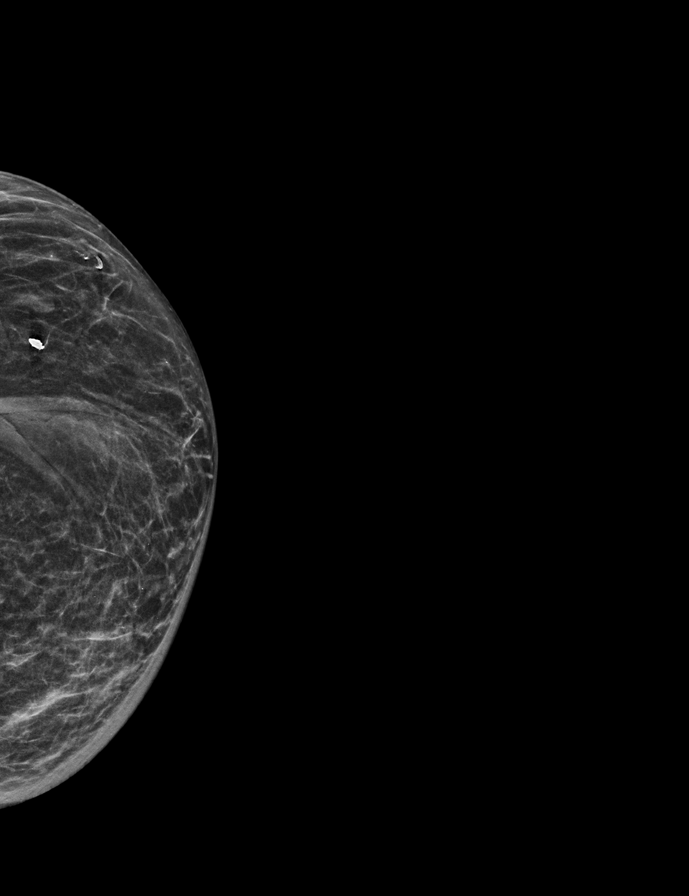

[8 of 38 positions shown; findings below may reference images not displayed]

ACR Breast Density Category b: There are scattered areas of
fibroglandular density.
FINDINGS: There is a new spiculated mass within the outer right breast, at far
posterior depth, measuring approximately 2.2 cm greatest dimension.

There are also new pleomorphic calcifications within the upper-outer
quadrant of the right breast, at middle depth, with a suspicious
linear distribution, spanning 1.4 cm.

Mammographic images were processed with CAD.

Targeted ultrasound is performed, showing an irregular hypoechoic
mass in the right breast at the 9:30 o'clock axis, 5 cm from the
nipple, measuring 2 x 1.6 x 1.8 cm, corresponding to the
mammographic finding and palpable lump.

There is an additional irregular hypoechoic area within the right
breast at the 9:30 o'clock axis, 4 cm from the nipple, measuring
x 0.3 x 0.7 cm, with associated calcifications, with internal
vascularity, located less than 1 cm from the dominant mass, almost
certainly an associated satellite lesion. This may correspond to the
suspicious pleomorphic calcifications seen on mammogram.
IMPRESSION: 1. Irregular mass within the right breast at the 9:30 o'clock axis,
5 cm from the nipple, measuring 2 cm, corresponding to the palpable
lump. This is a highly suspicious finding for which
ultrasound-guided biopsy is recommended.
2. Suspicious pleomorphic calcifications within the upper-outer
quadrant of the right breast, at middle depth, spanning 1.4 cm,
highly suspicious for multifocal disease. Hypoechoic area seen by
ultrasound within the right breast at the 9:30 o'clock axis, 4 cm
from the nipple, is a possible correlate for these suspicious
calcifications, alternatively an additional satellite lesion.

RECOMMENDATION:
1. Ultrasound-guided biopsy of the suspicious right breast mass at
the 9:30 o'clock axis, 5 cm from the nipple, measuring 2 cm,
corresponding to the palpable lump.
2. If breast conservation surgery is considered at some point, would
then consider additional ultrasound-guided biopsy of the suspected
satellite lesion adjacent to the dominant mass and/or stereotactic
biopsy of the suspicious pleomorphic calcifications within the
upper-outer quadrant of the right breast.
Ultrasound-guided biopsy is scheduled for [DATE][REDACTED].

I have discussed the findings and recommendations with the patient.
Results were also provided in writing at the conclusion of the
visit. If applicable, a reminder letter will be sent to the patient
regarding the next appointment.

BI-RADS CATEGORY  5: Highly suggestive of malignancy.

## 2018-02-12 ENCOUNTER — Other Ambulatory Visit: Payer: Self-pay | Admitting: *Deleted

## 2018-02-12 DIAGNOSIS — N6310 Unspecified lump in the right breast, unspecified quadrant: Secondary | ICD-10-CM

## 2018-02-13 ENCOUNTER — Ambulatory Visit: Payer: Medicare Other | Admitting: Oncology

## 2018-02-13 ENCOUNTER — Other Ambulatory Visit: Payer: Medicare Other

## 2018-02-13 DIAGNOSIS — C50812 Malignant neoplasm of overlapping sites of left female breast: Secondary | ICD-10-CM | POA: Insufficient documentation

## 2018-02-13 DIAGNOSIS — Z170 Estrogen receptor positive status [ER+]: Secondary | ICD-10-CM

## 2018-02-13 DIAGNOSIS — C50411 Malignant neoplasm of upper-outer quadrant of right female breast: Secondary | ICD-10-CM | POA: Insufficient documentation

## 2018-02-17 ENCOUNTER — Other Ambulatory Visit: Payer: Self-pay | Admitting: Family Medicine

## 2018-02-27 ENCOUNTER — Other Ambulatory Visit: Payer: Self-pay | Admitting: Family Medicine

## 2018-03-01 ENCOUNTER — Other Ambulatory Visit: Payer: Self-pay | Admitting: Family Medicine

## 2018-03-02 ENCOUNTER — Other Ambulatory Visit: Payer: Self-pay | Admitting: Family Medicine

## 2018-03-12 ENCOUNTER — Other Ambulatory Visit: Payer: Self-pay | Admitting: *Deleted

## 2018-03-12 MED ORDER — FUROSEMIDE 20 MG PO TABS: 20.0000 mg | ORAL_TABLET | Freq: Every day | ORAL | 0 refills | Status: DC

## 2018-03-24 ENCOUNTER — Other Ambulatory Visit: Payer: Self-pay | Admitting: Family Medicine

## 2018-04-06 ENCOUNTER — Other Ambulatory Visit: Payer: Self-pay | Admitting: Family Medicine

## 2018-04-28 ENCOUNTER — Other Ambulatory Visit: Payer: Self-pay | Admitting: Family Medicine

## 2018-05-09 ENCOUNTER — Other Ambulatory Visit: Payer: Self-pay | Admitting: Family Medicine

## 2018-06-12 ENCOUNTER — Ambulatory Visit: Payer: Medicare Other | Admitting: Family Medicine

## 2018-06-27 ENCOUNTER — Ambulatory Visit: Payer: Medicare Other | Admitting: Family Medicine

## 2018-06-27 ENCOUNTER — Encounter: Payer: Self-pay | Admitting: Family Medicine

## 2018-06-27 ENCOUNTER — Other Ambulatory Visit: Payer: Self-pay | Admitting: Family Medicine

## 2018-06-27 DIAGNOSIS — F0280 Dementia in other diseases classified elsewhere without behavioral disturbance: Secondary | ICD-10-CM | POA: Diagnosis not present

## 2018-06-27 DIAGNOSIS — C50411 Malignant neoplasm of upper-outer quadrant of right female breast: Secondary | ICD-10-CM | POA: Diagnosis not present

## 2018-06-27 DIAGNOSIS — G308 Other Alzheimer's disease: Secondary | ICD-10-CM

## 2018-06-27 DIAGNOSIS — N289 Disorder of kidney and ureter, unspecified: Secondary | ICD-10-CM | POA: Diagnosis not present

## 2018-06-27 DIAGNOSIS — R634 Abnormal weight loss: Secondary | ICD-10-CM | POA: Diagnosis not present

## 2018-06-27 DIAGNOSIS — E782 Mixed hyperlipidemia: Secondary | ICD-10-CM

## 2018-06-27 DIAGNOSIS — E063 Autoimmune thyroiditis: Secondary | ICD-10-CM | POA: Diagnosis not present

## 2018-06-27 DIAGNOSIS — E038 Other specified hypothyroidism: Secondary | ICD-10-CM

## 2018-06-27 DIAGNOSIS — Z170 Estrogen receptor positive status [ER+]: Secondary | ICD-10-CM

## 2018-06-27 DIAGNOSIS — R739 Hyperglycemia, unspecified: Secondary | ICD-10-CM | POA: Diagnosis not present

## 2018-06-27 NOTE — Patient Instructions (Signed)
Yellow split pea powder at [email or web address]  Carnation instant breakfast  Encouraged increased hydration and fiber in diet. Daily probiotics. If bowels not moving can use MOM 2 tbls po in 4 oz of warm prune juice by mouth every 2-3 days. If no results then repeat in 4 hours with  Dulcolax suppository pr, may repeat again in 4 more hours as needed. Seek care if symptoms worsen. Consider daily Miralax and/or Dulcolax if symptoms persist.  Dementia Caregiver Guide Dementia is a term used to describe a number of symptoms that affect memory and thinking. The most common symptoms include:  Memory loss.  Trouble with language and communication.  Trouble concentrating.  Poor judgment.  Problems with reasoning.  Child-like behavior and language.  Extreme anxiety.  Angry outbursts.  Wandering from home or public places.  Dementia usually gets worse slowly over time. In the early stages, people with dementia can stay independent and safe with some help. In later stages, they need help with daily tasks such as dressing, grooming, and using the bathroom. How to help the person with dementia cope Dementia can be frightening and confusing. Here are some tips to help the person with dementia cope with changes caused by the disease. General tips  Keep the person on track with his or her routine.  Try to identify areas where the person may need help.  Be supportive, patient, calm, and encouraging.  Gently remind the person that adjusting to changes takes time.  Help with the tasks that the person has asked for help with.  Keep the person involved in daily tasks and decisions as much as possible.  Encourage conversation, but try not to get frustrated or harried if the person struggles to find words or does not seem to appreciate your help. Communication tips  When the person is talking or seems frustrated, make eye contact and hold the person's hand.  Ask specific questions that need  yes or no answers.  Use simple words, short sentences, and a calm voice. Only give one direction at a time.  When offering choices, limit them to just 1 or 2.  Avoid correcting the person in a negative way.  If the person is struggling to find the right words, gently try to help him or her. How to recognize symptoms of stress Symptoms of stress in caregivers include:  Feeling frustrated or angry with the person with dementia.  Denying that the person has dementia or that his or her symptoms will not improve.  Feeling hopeless and unappreciated.  Difficulty sleeping.  Difficulty concentrating.  Feeling anxious, irritable, or depressed.  Developing stress-related health problems.  Feeling like you have too little time for your own life.  Follow these instructions at home:  Make sure that you and the person you are caring for: ? Get regular sleep. ? Exercise regularly. ? Eat regular, nutritious meals. ? Drink enough fluid to keep your urine clear or pale yellow. ? Take over-the-counter and prescription medicines only as told by your health care providers. ? Attend all scheduled health care appointments.  Join a support group with others who are caregivers.  Ask about respite care resources so that you can have a regular break from the stress of caregiving.  Look for signs of stress in yourself and in the person you are caring for. If you notice signs of stress, take steps to manage it.  Consider any safety risks and take steps to avoid them.  Organize medications in a pill box for  each day of the week.  Create a plan to handle any legal or financial matters. Get legal or financial advice if needed.  Keep a calendar in a central location to remind the person of appointments or other activities. Tips for reducing the risk of injury  Keep floors clear of clutter. Remove rugs, magazine racks, and floor lamps.  Keep hallways well lit, especially at night.  Put a  handrail and nonslip mat in the bathtub or shower.  Put childproof locks on cabinets that contain dangerous items, such as medicines, alcohol, guns, toxic cleaning items, sharp tools or utensils, matches, and lighters.  Put the locks in places where the person cannot see or reach them easily. This will help ensure that the person does not wander out of the house and get lost.  Be prepared for emergencies. Keep a list of emergency phone numbers and addresses in a convenient area.  Remove car keys and lock garage doors so that the person does not try to get in the car and drive.  Have the person wear a bracelet that tracks locations and identifies the person as having memory problems. This should be worn at all times for safety. Where to find support: Many individuals and organizations offer support. These include:  Support groups for people with dementia and for caregivers.  Counselors or therapists.  Home health care services.  Adult day care centers.  Where to find more information: Alzheimer's Association: [employer] Contact a health care provider if:  The person's health is rapidly getting worse.  You are no longer able to care for the person.  Caring for the person is affecting your physical and emotional health.  The person threatens himself or herself, you, or anyone else. Summary  Dementia is a term used to describe a number of symptoms that affect memory and thinking.  Dementia usually gets worse slowly over time.  Take steps to reduce the person's risk of injury, and to plan for future care.  Caregivers need support, relief from caregiving, and time for their own lives. This information is not intended to replace advice given to you by your health care provider. Make sure you discuss any questions you have with your health care provider. Document Released: 11/13/2016 Document Revised: 11/13/2016 Document Reviewed: 11/13/2016 Elsevier Interactive Patient Education   [employer].

## 2018-06-29 NOTE — Assessment & Plan Note (Signed)
They report she eats well when food is placed in front of her but she does not ask for food and she sleeps many hours daily. Encouraged to increase protein intake and attempt to keep her well hydrted.

## 2018-06-29 NOTE — Assessment & Plan Note (Addendum)
Drinks adequate amounts of fluid daily and labs stable previously will forego lab work today. Her family brings in a copy of a recent CMP done by her oncologist in [place] and it shows an improvement in her creatinine from her last visit here.

## 2018-06-29 NOTE — Assessment & Plan Note (Signed)
hgba1c acceptable, minimize simple carbs. Increase exercise as tolerated.  

## 2018-06-29 NOTE — Assessment & Plan Note (Signed)
She continues to do well with full time care from family members. She is now living with her son and daughter in law in [place], she sleeps many hours each day but eats well when food is offered to her. No new concerns or behavioral concerns. No changes.

## 2018-06-29 NOTE — Assessment & Plan Note (Signed)
On Levothyroxine, continue to monitor 

## 2018-06-29 NOTE — Assessment & Plan Note (Signed)
Tolerating statin, encouraged heart healthy diet, avoid trans fats, minimize simple carbs and saturated fats. Increase exercise as tolerated 

## 2018-06-29 NOTE — Assessment & Plan Note (Signed)
She is following with an oncologist in [place] now since she is living there with her son who accompanies her today. She is tolerating the Femara and they report her disease has stabilized.

## 2018-06-29 NOTE — Progress Notes (Signed)
Subjective:    Patient ID: Carrie Stephens, female    DOB: 12-28-1919, 84 y.o.   MRN: [id number]  Chief Complaint  Patient presents with  . Follow-up    Pt here for 6 month f/u visit.     HPI Patient is in today for follow up and is accompanied by her son and daughter in law with whom she now lives in [place]. They report overall she is doing well. No recent febrile illness or hospitalizations. She continues to be cared for at home but needs total care. She eats well if offered food. Sleeps over 12 hours a day. Denies CP/palp/SOB/HA/congestion/fevers/GI or GU c/o. Taking meds as prescribed  Past Medical History:  Diagnosis Date  . Alzheimer disease    moderate to severe  . 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 Depression   . History of UTI   . Hyperglycemia 11/15/2013  . Hyperlipidemia   . Hypernatremia 07/05/2017  . Macular degeneration 11/24/2015  . Medicare annual wellness visit, subsequent 10/17/2014  . Osteoporosis    s/p zometa  3.3 mg IV 05/2007  . Radiation 04/2006   s/p external radiation -Arimidex since 04/12/2006  . Renal insufficiency 11/15/2013  . S/P lumpectomy of breast 01/2006   and sentinel node dissection   . Thyroid nodule   . Vision loss, left eye    secondary to retinal hemorrhage, unclear etiology    Past Surgical History:  Procedure Laterality Date  . BREAST LUMPECTOMY Left 2007  . LOBECTOMY  08/2009   thyroid right-mixed and macrofollicular adenoma, 8.5 cm, malignant features are not identified  . VAGINAL HYSTERECTOMY  1960's   status post    Family History  Problem Relation Age of Onset  . Breast cancer Mother   . Diabetes Father        adult onset type 2  . Other Neg Hx        No report of heart disease or stroke in the family    Social History   Socioeconomic History  . Marital status: Widowed   Spouse name: Not on file  . Number of children: 3  . Years of education: Not on file  . Highest education level: Not on file  Occupational History  . 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 Financial resource strain: Not on file  . Food insecurity:    Worry: Not on file    Inability: Not on file  . Transportation needs:    Medical: Not on file    Non-medical: Not on file  Tobacco Use  . Smoking status: Former Research scientist (life sciences)  . Smokeless tobacco: Never Used  . Tobacco comment: light smoker- smoked 3 or 4 cigarettes a day for 55 years  Substance and Sexual Activity  . Alcohol use: No  . Drug use: No  . Sexual activity: Never  Lifestyle  . Physical activity:    Days per week: Not on file    Minutes per session: Not on file  . Stress: Not on file  Relationships  . Social connections:    Talks on phone: Not on file    Gets together: Not on file    Attends religious service: Not on file    Active member of club or organization: Not on file  Attends meetings of clubs or organizations: Not on file    Relationship status: Not on file  . Intimate partner violence:    Fear of current or ex partner: Not on file    Emotionally abused: Not on file    Physically abused: Not on file    Forced sexual activity: Not on file  Other Topics Concern  . Not on file  Social History Narrative   Patient is widowed, is the retired Public relations account executive   currently lives with her daughter.  She has a total of 3 children, two daughters and one son.     No alcohol.  She quit tobacco approximately 2 years ago.  She   was a light smoker, smoked 3 or 4 cigarettes a day for 55 years.      Outpatient Medications Prior to Visit  Medication Sig Dispense Refill  . aspirin 81 MG tablet Take 81 mg by mouth daily.      . calcium carbonate (OS-CAL) 600 MG TABS Take 600 mg by mouth 2 (two) times daily with a meal.      . Cholecalciferol (VITAMIN D3) 1000 UNITS  tablet Take 1,000 Units by mouth daily.      . Cyanocobalamin 1000 MCG CAPS Take by mouth.    . donepezil (ARICEPT) 10 MG tablet TAKE 1 TABLET (10 MG TOTAL) BY MOUTH AT BEDTIME. 30 tablet 3  . furosemide (LASIX) 20 MG tablet Take 1 tablet (20 mg total) by mouth daily. 90 tablet 0  . KLOR-CON M10 10 MEQ tablet TAKE 1 TABLET (10 MEQ TOTAL) BY MOUTH DAILY. 90 tablet 0  . letrozole (FEMARA) 2.5 MG tablet TAKE 1 TABLET BY MOUTH ONCE  3  . levothyroxine (SYNTHROID, LEVOTHROID) 50 MCG tablet TAKE 1 TABLET BY MOUTH EVERY DAY 30 tablet 2  . memantine (NAMENDA XR) 28 MG CP24 24 hr capsule TAKE 1 CAPSULE BY MOUTH EVERY DAY 90 capsule 1  . mirtazapine (REMERON) 15 MG tablet TAKE 1 TABLET BY MOUTH AT BEDTIME 90 tablet 1  . Probiotic Product (PROBIOTIC-10 PO) Take by mouth.    . simvastatin (ZOCOR) 10 MG tablet TAKE 1 TABLET (10 MG TOTAL) BY MOUTH DAILY AT 6 PM. 90 tablet 0  . traMADol (ULTRAM) 50 MG tablet Take 1 tablet (50 mg total) by mouth every 12 (twelve) hours as needed for severe pain. 8 tablet 0   No facility-administered medications prior to visit.     Allergies  Allergen Reactions  . Codeine Phosphate     REACTION: unspecified  . Ibuprofen     REACTION: unspecified  . Moxifloxacin     REACTION: rash    Review of Systems  Constitutional: Positive for malaise/fatigue. Negative for fever.  HENT: Negative for congestion.   Eyes: Negative for blurred vision.  Respiratory: Negative for cough.   Cardiovascular: Negative for chest pain and palpitations.  Gastrointestinal: Negative for vomiting.  Musculoskeletal: Negative for back pain.  Skin: Negative for rash.  Neurological: Negative for loss of consciousness and headaches.  Psychiatric/Behavioral: Positive for memory loss.       Objective:    Physical Exam  Constitutional: She is oriented to person, place, and time. She appears well-developed and well-nourished. No distress.  HENT:  Head: Normocephalic and atraumatic.  Nose: Nose  normal.  Eyes: Right eye exhibits no discharge. Left eye exhibits no discharge.  Neck: Normal range of motion. Neck supple.  Cardiovascular: Normal rate and regular rhythm.  No murmur heard. Pulmonary/Chest: Effort normal and  breath sounds normal.  Abdominal: Soft. Bowel sounds are normal. There is no tenderness.  Musculoskeletal: She exhibits no edema.  Neurological: She is alert and oriented to person, place, and time.  Skin: Skin is warm and dry.  Psychiatric: She has a normal mood and affect.  Nursing note and vitals reviewed.   BP (!) 114/91 (BP Location: Right Arm, Patient Position: Sitting, Cuff Size: Normal)   Pulse (!) 48   Temp 98 F (36.7 C) (Oral)   Ht 5\' 8"  (1.727 m)   Wt 138 lb 3.2 oz (62.7 kg)   SpO2 100%   BMI 21.01 kg/m  Wt Readings from Last 3 Encounters:  06/27/18 138 lb 3.2 oz (62.7 kg)  12/12/17 143 lb (64.9 kg)  09/12/17 148 lb 3.2 oz (67.2 kg)     Lab Results  Component Value Date   WBC 6.4 12/12/2017   HGB 13.7 12/12/2017   HCT 41.8 12/12/2017   PLT 265.0 12/12/2017   GLUCOSE 82 12/12/2017   CHOL 148 12/12/2017   TRIG 124.0 12/12/2017   HDL 68.60 12/12/2017   LDLDIRECT 72.0 11/24/2015   LDLCALC 54 12/12/2017   ALT 8 12/12/2017   AST 18 12/12/2017   NA 144 12/12/2017   K 4.4 12/12/2017   CL 106 12/12/2017   CREATININE 1.22 (H) 12/12/2017   BUN 17 12/12/2017   CO2 27 12/12/2017   TSH 0.96 12/12/2017   HGBA1C 5.6 12/12/2017    Lab Results  Component Value Date   TSH 0.96 12/12/2017   Lab Results  Component Value Date   WBC 6.4 12/12/2017   HGB 13.7 12/12/2017   HCT 41.8 12/12/2017   MCV 95.0 12/12/2017   PLT 265.0 12/12/2017   Lab Results  Component Value Date   NA 144 12/12/2017   K 4.4 12/12/2017   CO2 27 12/12/2017   GLUCOSE 82 12/12/2017   BUN 17 12/12/2017   CREATININE 1.22 (H) 12/12/2017   BILITOT 0.7 12/12/2017   ALKPHOS 59 12/12/2017   AST 18 12/12/2017   ALT 8 12/12/2017   PROT 6.8 12/12/2017   ALBUMIN 4.1  12/12/2017   CALCIUM 9.2 12/12/2017   GFR 52.57 (L) 12/12/2017   Lab Results  Component Value Date   CHOL 148 12/12/2017   Lab Results  Component Value Date   HDL 68.60 12/12/2017   Lab Results  Component Value Date   LDLCALC 54 12/12/2017   Lab Results  Component Value Date   TRIG 124.0 12/12/2017   Lab Results  Component Value Date   CHOLHDL 2 12/12/2017   Lab Results  Component Value Date   HGBA1C 5.6 12/12/2017       Assessment & Plan:   Problem List Items Addressed This Visit    Hypothyroidism    On Levothyroxine, continue to monitor      Hyperlipidemia    Tolerating statin, encouraged heart healthy diet, avoid trans fats, minimize simple carbs and saturated fats. Increase exercise as tolerated      ALZHEIMER'S DISEASE    She continues to do well with full time care from family members. She is now living with her son and daughter in law in [place], she sleeps many hours each day but eats well when food is offered to her. No new concerns or behavioral concerns. No changes.       Renal insufficiency    Drinks adequate amounts of fluid daily and labs stable previously will forego lab work today. Her family brings in a copy  of a recent CMP done by her oncologist in [place] and it shows an improvement in her creatinine from her last visit here.       Hyperglycemia    hgba1c acceptable, minimize simple carbs. Increase exercise as tolerated.       Weight loss, non-intentional    They report she eats well when food is placed in front of her but she does not ask for food and she sleeps many hours daily. Encouraged to increase protein intake and attempt to keep her well hydrted.      Malignant neoplasm of upper-outer quadrant of right breast in female, estrogen receptor positive ([place])    She is following with an oncologist in [place] now since she is living there with her son who accompanies her today. She is tolerating the Femara and they report her disease has stabilized.         Relevant Medications   letrozole (FEMARA) 2.5 MG tablet      I am having Ruwayda B. Liddell maintain her aspirin, calcium carbonate, cholecalciferol, Cyanocobalamin, Probiotic Product (PROBIOTIC-10 PO), traMADol, memantine, mirtazapine, furosemide, levothyroxine, simvastatin, donepezil, KLOR-CON M10, and letrozole.  No orders of the defined types were placed in this encounter.    Penni Homans, MD

## 2018-07-02 NOTE — Progress Notes (Deleted)
Subjective:   Carrie Stephens is a 84 y.o. female who presents for Medicare Annual (Subsequent) preventive examination.  Review of Systems: No ROS.  Medicare Wellness Visit. Additional risk factors are reflected in the social history.   Sleep patterns: Home Safety/Smoke Alarms: Feels safe in home. Smoke alarms in place.  Living environment; residence and Firearm Safety:    Female:         Mammo-       Dexa scan-        CCS-    Objective:     Vitals: There were no vitals taken for this visit.  There is no height or weight on file to calculate BMI.  Advanced Directives 07/04/2017 11/24/2015 10/11/2014  Does Patient Have a Medical Advance Directive? Yes - Yes  Type of Advance Directive [place];Living will [place];Living will [place];Living will  Does patient want to make changes to medical advance directive? - - No - Patient declined  Copy of [place] in Chart? No - copy requested Yes No - copy requested    Tobacco Social History   Tobacco Use  Smoking Status Former Smoker  Smokeless Tobacco Never Used  Tobacco Comment   light smoker- smoked 3 or 4 cigarettes a day for 16 years     Counseling given: Not Answered Comment: light smoker- smoked 3 or 4 cigarettes a day for 55 years   Clinical Intake:                       Past Medical History:  Diagnosis Date  . Alzheimer disease    moderate to severe  . 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 Depression   . History of UTI   . Hyperglycemia 11/15/2013  . Hyperlipidemia   . Hypernatremia 07/05/2017  . Macular degeneration 11/24/2015  . Medicare annual wellness visit, subsequent 10/17/2014  . Osteoporosis    s/p zometa  3.3 mg IV 05/2007  . Radiation 04/2006   s/p external radiation  -Arimidex since 04/12/2006  . Renal insufficiency 11/15/2013  . S/P lumpectomy of breast 01/2006   and sentinel node dissection   . Thyroid nodule   . Vision loss, left eye    secondary to retinal hemorrhage, unclear etiology   Past Surgical History:  Procedure Laterality Date  . BREAST LUMPECTOMY Left 2007  . LOBECTOMY  08/2009   thyroid right-mixed and macrofollicular adenoma, 8.5 cm, malignant features are not identified  . VAGINAL HYSTERECTOMY  1960's   status post   Family History  Problem Relation Age of Onset  . Breast cancer Mother   . Diabetes Father        adult onset type 2  . Other Neg Hx        No report of heart disease or stroke in the family   Social History   Socioeconomic History  . Marital status: Widowed    Spouse name: Not on file  . Number of children: 3  . Years of education: Not on file  . Highest education level: Not on file  Occupational History  . 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 Financial resource strain: Not on file  . Food insecurity:  Worry: Not on file    Inability: Not on file  . Transportation needs:    Medical: Not on file    Non-medical: Not on file  Tobacco Use  . Smoking status: Former Research scientist (life sciences)  . Smokeless tobacco: Never Used  . Tobacco comment: light smoker- smoked 3 or 4 cigarettes a day for 55 years  Substance and Sexual Activity  . Alcohol use: No  . Drug use: No  . Sexual activity: Never  Lifestyle  . Physical activity:    Days per week: Not on file    Minutes per session: Not on file  . Stress: Not on file  Relationships  . Social connections:    Talks on phone: Not on file    Gets together: Not on file    Attends religious service: Not on file    Active member of club or organization: Not on file    Attends meetings of clubs or organizations: Not on file    Relationship status: Not on file  Other Topics Concern  . 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  She has a total of 3 children, two daughters and one son.     No alcohol.  She quit tobacco approximately 2 years ago.  She   was a light smoker, smoked 3 or 4 cigarettes a day for 55 years.      Outpatient Encounter Medications as of 07/07/2018  Medication Sig  . aspirin 81 MG tablet Take 81 mg by mouth daily.    . calcium carbonate (OS-CAL) 600 MG TABS Take 600 mg by mouth 2 (two) times daily with a meal.    . Cholecalciferol (VITAMIN D3) 1000 UNITS tablet Take 1,000 Units by mouth daily.    . Cyanocobalamin 1000 MCG CAPS Take by mouth.  . donepezil (ARICEPT) 10 MG tablet TAKE 1 TABLET (10 MG TOTAL) BY MOUTH AT BEDTIME.  . furosemide (LASIX) 20 MG tablet Take 1 tablet (20 mg total) by mouth daily.  Marland Kitchen KLOR-CON M10 10 MEQ tablet TAKE 1 TABLET (10 MEQ TOTAL) BY MOUTH DAILY.  Marland Kitchen letrozole (FEMARA) 2.5 MG tablet TAKE 1 TABLET BY MOUTH ONCE  . levothyroxine (SYNTHROID, LEVOTHROID) 50 MCG tablet TAKE 1 TABLET BY MOUTH EVERY DAY  . memantine (NAMENDA XR) 28 MG CP24 24 hr capsule TAKE 1 CAPSULE BY MOUTH EVERY DAY  . mirtazapine (REMERON) 15 MG tablet TAKE 1 TABLET BY MOUTH AT BEDTIME  . Probiotic Product (PROBIOTIC-10 PO) Take by mouth.  . simvastatin (ZOCOR) 10 MG tablet TAKE 1 TABLET (10 MG TOTAL) BY MOUTH DAILY AT 6 PM.  . traMADol (ULTRAM) 50 MG tablet Take 1 tablet (50 mg total) by mouth every 12 (twelve) hours as needed for severe pain.   No facility-administered encounter medications on file as of 07/07/2018.     Activities of Daily Living In your present state of health, do you have any difficulty performing the following activities: 07/04/2017  Hearing? Y  Comment HOH  Vision? N  Difficulty concentrating or making decisions? Y  Walking or climbing stairs? N  Dressing or bathing? Y  Comment daughter asssists  Doing errands, shopping? Y  Comment does not Physiological scientist  and eating ? Y  Using the Toilet? N  In the past six months, have you accidently leaked urine? N  Do you have problems with loss of bowel control? N  Managing your Medications? Y  Managing your Finances? Y  Housekeeping or managing your Housekeeping? Y  Some recent data might be hidden    Patient Care Team: Mosie Lukes, MD as PCP - General (Family Medicine)    Assessment:   This is a routine wellness examination for Valentina.  Exercise Activities and Dietary recommendations    Goals    None      Fall Risk Fall Risk  07/04/2017 11/12/2016 11/24/2015 10/11/2014 11/11/2013  Falls in the past year? Yes Yes No No No  Number falls in past yr: 1 (No Data) - - -  Comment - First fall - - -  Injury with Fall? Yes No - - -  Follow up Education provided;Falls prevention discussed - - - -   Is the patient's home free of loose throw rugs in walkways, pet beds, electrical cords, etc?   {Blank single:19197::"yes","no"}      Grab bars in the bathroom? {Blank single:19197::"yes","no"}      Handrails on the stairs?   {Blank single:19197::"yes","no"}      Adequate lighting?   {Blank single:19197::"yes","no"}  Timed Get Up and Go performed: ***  Depression Screen PHQ 2/9 Scores 07/04/2017 11/12/2016 11/12/2016 11/24/2015  PHQ - 2 Score 0 1 0 0     Cognitive Function MMSE - Mini Mental State Exam 07/04/2017  Not completed: Unable to complete        Immunization History  Administered Date(s) Administered  . Influenza Split 09/04/2012  . Influenza Whole 09/27/2009, 10/03/2010  . Influenza,inj,Quad PF,6+ Mos 11/24/2015  . Influenza-Unspecified 08/24/2013  . Pneumococcal Conjugate-13 11/24/2015  . Pneumococcal Polysaccharide-23 12/12/2017  . Tdap 11/11/2013    Qualifies for Shingles Vaccine?***  Screening Tests Health Maintenance  Topic Date Due  . INFLUENZA VACCINE  07/24/2018  . TETANUS/TDAP  11/12/2023  . DEXA SCAN  Completed  . PNA vac Low Risk Adult  Completed     Cancer Screenings: Lung: Low Dose CT Chest recommended if Age 35-80 years, 30 pack-year currently smoking OR have quit w/in 15years. Patient {DOES NOT does:27190::"does not"} qualify. Breast:  Up to date on Mammogram? {Yes/No:30480221}   Up to date of Bone Density/Dexa? {Yes/No:30480221} Colorectal: ***  Additional Screenings: ***: Hepatitis C Screening:      Plan:   ***   I have personally reviewed and noted the following in the patient's chart:   . Medical and social history . Use of alcohol, tobacco or illicit drugs  . Current medications and supplements . Functional ability and status . Nutritional status . Physical activity . Advanced directives . List of other physicians . Hospitalizations, surgeries, and ER visits in previous 12 months . Vitals . Screenings to include cognitive, depression, and falls . Referrals and appointments  In addition, I have reviewed and discussed with patient certain preventive protocols, quality metrics, and best practice recommendations. A written personalized care plan for preventive services as well as general preventive health recommendations were provided to patient.     Shela Nevin, [place]  07/02/2018

## 2018-07-07 ENCOUNTER — Other Ambulatory Visit: Payer: Self-pay | Admitting: Family Medicine

## 2018-07-07 ENCOUNTER — Ambulatory Visit: Payer: Medicare Other | Admitting: *Deleted

## 2018-07-28 ENCOUNTER — Other Ambulatory Visit: Payer: Self-pay | Admitting: Family Medicine

## 2018-08-21 ENCOUNTER — Other Ambulatory Visit: Payer: Self-pay | Admitting: Family Medicine

## 2018-09-05 ENCOUNTER — Other Ambulatory Visit: Payer: Self-pay | Admitting: Family Medicine

## 2018-09-09 ENCOUNTER — Telehealth: Payer: Self-pay | Admitting: Family Medicine

## 2018-09-09 NOTE — Telephone Encounter (Signed)
Copied from [place] [id number]. Topic: General - Other >> Sep 08, 2018  6:14 PM Valla Leaver wrote: Reason for CRM: Son, Evern Core, requesting a call back from Dr. Randel Pigg to discuss recent fall the patient had and other medical questions regarding her care.    [place] Can you call patients son and request a visit to discuss information w/ PCP?  Please advise

## 2018-09-09 NOTE — Telephone Encounter (Signed)
Please contact the son, patient is not aware of what is going on at this time

## 2018-09-09 NOTE — Telephone Encounter (Signed)
Pt is in the hospital so he did not want to make an appt he just wants someone to call him back and let him know if she had a history of herniated disc. [phone]  I did let him know she will need a hospital follow up but he did not want to set that up. He is concerned about her fall and would rather speak to a nurse or Dr. Charlett Blake about what is going on.

## 2018-09-10 NOTE — Telephone Encounter (Signed)
I am not sure you meant to route this back to me so I am going to route to Dr. Jacinto Reap.

## 2018-09-11 NOTE — Telephone Encounter (Signed)
Left message for patients son to give me a call back

## 2018-09-11 NOTE — Telephone Encounter (Signed)
Spoke with patients son he stated he wanted to know if she has had degenerative changes.  She is now in the hospital in [place], [place].  He states his mom only stays with him for about 4 months out the year.

## 2018-09-11 NOTE — Telephone Encounter (Signed)
Yes her xray did show degenerative changes. If he does not already know please let him know

## 2018-09-11 NOTE — Telephone Encounter (Signed)
Please call and confirm what questions he has. I reviewed her chart and she has only had one xray of her low back since she joined [place] and it showed appropriate age related degenerative disc changes but xrays do not give tremendous detail. Hope that helps. Cannot see any hospital records that I can review or help with

## 2018-09-12 NOTE — Telephone Encounter (Signed)
Pt's son not on DPR- looks like we have been giving information?

## 2018-09-16 ENCOUNTER — Other Ambulatory Visit: Payer: Self-pay | Admitting: Family Medicine

## 2018-10-13 ENCOUNTER — Telehealth: Payer: Self-pay | Admitting: Family Medicine

## 2018-10-13 DIAGNOSIS — G308 Other Alzheimer's disease: Principal | ICD-10-CM

## 2018-10-13 DIAGNOSIS — F0280 Dementia in other diseases classified elsewhere without behavioral disturbance: Secondary | ICD-10-CM

## 2018-10-13 NOTE — Telephone Encounter (Signed)
Please advise 

## 2018-10-13 NOTE — Telephone Encounter (Signed)
So there is shot acting Namenda 10 mg twice daily and that can be crushed but it is also not unreasonable to just stop the Namenda. Once people hit end stage dementia it is not usually very helpful. If the want can send in 60 with 1 rf. If she is going to stay in [place] they may need to find her a doctor closer to he.

## 2018-10-13 NOTE — Telephone Encounter (Signed)
Copied from [place] [id number]. Topic: Quick Communication - See Telephone Encounter >> Oct 13, 2018 11:12 AM Conception Chancy, NT wrote: CRM for notification. See Telephone encounter for: 10/13/18.  Patient son Gabriel Rung is calling and states that the patient has been in the hospital and in rehab and she can not swallow pills good. The patient is on memantine (NAMENDA XR) 28 MG CP24 24 hr capsule and they can not crush that. He is requesting a medication called in to replace this so he can crush it and it is easy to be given to her. The patient is in [place] with him. Cb# [phone]  [id number] [place], [address] [place] [address] Phone: [phone] Fax: [phone]

## 2018-10-16 MED ORDER — MEMANTINE HCL 10 MG PO TABS: 10.0000 mg | ORAL_TABLET | Freq: Two times a day (BID) | ORAL | 1 refills | Status: DC

## 2018-10-16 NOTE — Telephone Encounter (Signed)
Chief Strategy Officer phoned daughter, Carrie Stephens, as Carrie Stephens is not on DPR, re: namenda medication. Carrie Stephens stated she is working on getting PCP established in [place]. Pt. already scheduled to see neurologist down in [place] 11/6, and neurology office stated they would work on getting her established with PCP. Author relayed Dr. Frederik Pear message, and Carrie Stephens stated they still want to stay on namenda, medication sent in per Dr. Charlett Blake. Carrie Stephens is aware that going forward, she will need to reach out to providers in [place] for medical needs. Author wished daughter and pt. well, asked if there was anything else we could do in meantime, and daughter declined, appreciative. Routed to Dr. Charlett Blake as Juluis Rainier.

## 2018-10-16 NOTE — Telephone Encounter (Signed)
Can you call this patients con and explain this to him. I called and was unable to leave a voicemail.  I have tried to explain to him several times that if his mom was going to stay in [place] half the year she needed to have a PCP there to help manage medications while she was there.

## 2018-10-22 ENCOUNTER — Other Ambulatory Visit: Payer: Self-pay | Admitting: Family Medicine

## 2018-12-05 ENCOUNTER — Other Ambulatory Visit: Payer: Self-pay | Admitting: Family Medicine

## 2018-12-05 DIAGNOSIS — F0280 Dementia in other diseases classified elsewhere without behavioral disturbance: Secondary | ICD-10-CM

## 2018-12-05 DIAGNOSIS — G308 Other Alzheimer's disease: Principal | ICD-10-CM

## 2018-12-06 ENCOUNTER — Other Ambulatory Visit: Payer: Self-pay | Admitting: Family Medicine

## 2019-01-14 ENCOUNTER — Other Ambulatory Visit: Payer: Self-pay | Admitting: Family Medicine

## 2019-02-16 ENCOUNTER — Other Ambulatory Visit: Payer: Self-pay | Admitting: Family Medicine

## 2019-08-07 ENCOUNTER — Other Ambulatory Visit: Payer: Self-pay | Admitting: Family Medicine

## 2019-11-23 ENCOUNTER — Other Ambulatory Visit: Payer: Self-pay | Admitting: Family Medicine

## 2020-07-11 ENCOUNTER — Other Ambulatory Visit: Payer: Self-pay | Admitting: Family Medicine

## 2021-01-04 ENCOUNTER — Other Ambulatory Visit: Payer: Self-pay | Admitting: Family Medicine

## 2021-09-29 ENCOUNTER — Other Ambulatory Visit: Payer: Self-pay | Admitting: Nurse Practitioner

## 2021-10-24 DEATH — deceased
